# Patient Record
Sex: Male | Born: 1950
Health system: Southern US, Community
[De-identification: ages and names within clinical notes are randomized; demographics above are authoritative.]

## PROBLEM LIST (undated history)

## (undated) DIAGNOSIS — I10 Essential (primary) hypertension: Secondary | ICD-10-CM

## (undated) DIAGNOSIS — J45909 Unspecified asthma, uncomplicated: Secondary | ICD-10-CM

## (undated) HISTORY — PX: PROSTATE SURGERY: SHX751

## (undated) HISTORY — DX: Essential (primary) hypertension: I10

## (undated) HISTORY — DX: Unspecified asthma, uncomplicated: J45.909

---

## 2009-02-06 ENCOUNTER — Emergency Department (HOSPITAL_COMMUNITY): Admission: EM | Admit: 2009-02-06 | Discharge: 2009-02-06 | Payer: Self-pay | Admitting: Emergency Medicine

## 2010-04-07 ENCOUNTER — Encounter (INDEPENDENT_AMBULATORY_CARE_PROVIDER_SITE_OTHER): Payer: Self-pay | Admitting: *Deleted

## 2010-04-07 ENCOUNTER — Ambulatory Visit: Payer: Self-pay | Admitting: Gastroenterology

## 2010-04-13 ENCOUNTER — Ambulatory Visit: Payer: Self-pay | Admitting: Gastroenterology

## 2011-01-30 NOTE — Letter (Signed)
Summary: [facility] Instructions  [facility]  [address] [place], [place] [address]   Phone: [phone]  Fax: [phone]       Racer Hanger    06/03/1950    MRN: [id number]        Procedure Day Dorna Bloom:  Lenor Coffin  04/13/10     Arrival Time:  7:30AM     Procedure Time:  8:30AM     Location of Procedure:                    _X _  [facility] (4th Floor)                        PREPARATION FOR COLONOSCOPY WITH MOVIPREP   Starting 5 days prior to your procedure 04/08/10 do not eat nuts, seeds, popcorn, corn, beans, peas,  salads, or any raw vegetables.  Do not take any fiber supplements (e.g. Metamucil, Citrucel, and Benefiber).  THE DAY BEFORE YOUR PROCEDURE         DATE: 04/12/10  DAY: WEDNESDAY  1.  Drink clear liquids the entire day-NO SOLID FOOD  2.  Do not drink anything colored red or purple.  Avoid juices with pulp.  No orange juice.  3.  Drink at least 64 oz. (8 glasses) of fluid/clear liquids during the day to prevent dehydration and help the prep work efficiently.  CLEAR LIQUIDS INCLUDE: Water Jello Ice Popsicles Tea (sugar ok, no milk/cream) Powdered fruit flavored drinks Coffee (sugar ok, no milk/cream) Gatorade Juice: apple, white grape, white cranberry  Lemonade Clear bullion, consomm, broth Carbonated beverages (any kind) Strained chicken noodle soup Hard Candy                             4.  In the morning, mix first dose of MoviPrep solution:    Empty 1 Pouch A and 1 Pouch B into the disposable container    Add lukewarm drinking water to the top line of the container. Mix to dissolve    Refrigerate (mixed solution should be used within 24 hrs)  5.  Begin drinking the prep at 5:00 p.m. The MoviPrep container is divided by 4 marks.   Every 15 minutes drink the solution down to the next mark (approximately 8 oz) until the full liter is complete.   6.  Follow completed prep with 16 oz of clear liquid of your choice  (Nothing red or purple).  Continue to drink clear liquids until bedtime.  7.  Before going to bed, mix second dose of MoviPrep solution:    Empty 1 Pouch A and 1 Pouch B into the disposable container    Add lukewarm drinking water to the top line of the container. Mix to dissolve    Refrigerate  THE DAY OF YOUR PROCEDURE      DATE: 04/13/10 DAY: THURSDAY  Beginning at 3:30AM (5 hours before procedure):         1. Every 15 minutes, drink the solution down to the next mark (approx 8 oz) until the full liter is complete.  2. Follow completed prep with 16 oz. of clear liquid of your choice.    3. You may drink clear liquids until 6:30AM (2 HOURS BEFORE PROCEDURE).   MEDICATION INSTRUCTIONS  Unless otherwise instructed, you should take regular prescription medications with a small sip of water   as early as possible the morning of  your procedure.          OTHER INSTRUCTIONS  You will need a responsible adult at least 59 years of age to accompany you and drive you home.   This person must remain in the waiting room during your procedure.  Wear loose fitting clothing that is easily removed.  Leave jewelry and other valuables at home.  However, you may wish to bring a book to read or  an iPod/MP3 player to listen to music as you wait for your procedure to start.  Remove all body piercing jewelry and leave at home.  Total time from sign-in until discharge is approximately 2-3 hours.  You should go home directly after your procedure and rest.  You can resume normal activities the  day after your procedure.  The day of your procedure you should not:   Drive   Make legal decisions   Operate machinery   Drink alcohol   Return to work  You will receive specific instructions about eating, activities and medications before you leave.    The above instructions have been reviewed and explained to me by   Wyona Almas RN  April 07, 2010 8:30 AM     I fully  understand and can verbalize these instructions _____________________________ Date _________

## 2011-01-30 NOTE — Miscellaneous (Signed)
Summary: [facility] Previsit/prep  Clinical Lists Changes  Medications: Added new medication of MOVIPREP 100 GM  SOLR (PEG-KCL-NACL-NASULF-NA ASC-C) As per prep instructions. - Signed Rx of MOVIPREP 100 GM  SOLR (PEG-KCL-NACL-NASULF-NA ASC-C) As per prep instructions.;  #1 x 0;  Signed;  Entered by: Wyona Almas RN;  Authorized by: Louis Meckel MD;  Method used: Electronically to [facility]*, [address] [place], [place], [place]  [address], Ph: [phone], Fax: [phone] Observations: Added new observation of NKA: T (04/07/2010 7:54)    Prescriptions: MOVIPREP 100 GM  SOLR (PEG-KCL-NACL-NASULF-NA ASC-C) As per prep instructions.  #1 x 0   Entered by:   Wyona Almas RN   Authorized by:   Louis Meckel MD   Signed by:   Wyona Almas RN on 04/07/2010   Method used:   Electronically to        [facility]* (retail)       [address]       [place], [place]  [address]       Ph: [phone]       Fax: [phone]   RxID:   [id number]

## 2011-01-30 NOTE — Procedures (Signed)
Summary: Colonoscopy  Patient: William Lutz Note: All result statuses are Final unless otherwise noted.  Tests: (1) Colonoscopy (COL)   COL Colonoscopy           DONE     [facility]     [address]     [place], [place]  [address]           COLONOSCOPY PROCEDURE REPORT           PATIENT:  William, Lutz  MR#:  [id number]     BIRTHDATE:  May 10, 1950, 60 yrs. old  GENDER:  male           ENDOSCOPIST:  Barbette Hair. Arlyce Dice, MD     Referred by:           PROCEDURE DATE:  04/13/2010     PROCEDURE:  Diagnostic Colonoscopy     ASA CLASS:  Class II     INDICATIONS:  1) Routine Risk Screening           MEDICATIONS:   Fentanyl 50 mcg IV, Versed 7 mg IV           DESCRIPTION OF PROCEDURE:   After the risks benefits and     alternatives of the procedure were thoroughly explained, informed     consent was obtained.  Digital rectal exam was performed and     revealed perianal skin tags, perianal dermatitis.   The LB     CF-H180AL [id number] endoscope was introduced through the anus and     advanced to the cecum, which was identified by both the appendix     and ileocecal valve, without limitations.  The quality of the prep     was excellent, using MoviPrep.  The instrument was then slowly     withdrawn as the colon was fully examined.     <<PROCEDUREIMAGES>>           FINDINGS:  Mild diverticulosis was found in the sigmoid colon (see     image12 and image13).  This was otherwise a normal examination of     the colon (see image1, image2, image3, image5, image7, image9,     image11, image14, and image15).   Retroflexed views in the rectum     revealed no abnormalities.    The time to cecum =  1) 6  minutes.     The scope was then withdrawn (time =  1) 5.5  min) from the     patient and the procedure completed.           COMPLICATIONS:  None           ENDOSCOPIC IMPRESSION:     1) Mild diverticulosis in the sigmoid colon     2) Otherwise normal examination     RECOMMENDATIONS:  1) Continue current colorectal screening recommendations for     "routine risk" patients with a repeat colonoscopy in 10 years.           REPEAT EXAM:  In 10 year(s) for Colonoscopy.           ______________________________     Barbette Hair. Arlyce Dice, MD           CC:           n.     eSIGNED:   Barbette Hair. Cordarius Benning at 04/13/2010 09:09 AM           Janace Hoard, [id number]  Note: An exclamation mark (!) indicates a result that was not  dispersed into the flowsheet. Document Creation Date: 04/13/2010 9:10 AM _______________________________________________________________________  (1) Order result status: Final Collection or observation date-time: 04/13/2010 09:04 Requested date-time:  Receipt date-time:  Reported date-time:  Referring Physician:   Ordering Physician: Melvia Heaps [phone]) Specimen Source:  Source: Launa Grill Order Number: [id number] Lab site:   Appended Document: Colonoscopy    Clinical Lists Changes  Observations: Added new observation of COLONNXTDUE: 03/2020 (04/13/2010 11:20)

## 2012-01-01 HISTORY — PX: COLONOSCOPY: SHX174

## 2015-03-28 ENCOUNTER — Telehealth: Payer: Self-pay | Admitting: Family Medicine

## 2015-03-28 NOTE — Telephone Encounter (Signed)
Appointment given for Monday with Tiffany.

## 2015-04-04 ENCOUNTER — Telehealth: Payer: Self-pay | Admitting: Physician Assistant

## 2015-04-04 ENCOUNTER — Ambulatory Visit: Payer: Self-pay | Admitting: Physician Assistant

## 2015-04-04 ENCOUNTER — Telehealth: Payer: Self-pay | Admitting: *Deleted

## 2015-04-04 NOTE — Telephone Encounter (Signed)
It is okay to refill the patient's blood pressure medication until he has an appointment to see the provider here. You may refill it for one month.

## 2015-04-04 NOTE — Telephone Encounter (Signed)
Patient had a new patient appointment with Tiffany today and was not able to reschedule until next week due to his work schedule. He wants to know if we can refill his lotrel until his appointment. If you will send back to me I will take care of.

## 2015-04-05 MED ORDER — AMLODIPINE BESY-BENAZEPRIL HCL 10-20 MG PO CAPS: 1.0000 | ORAL_CAPSULE | Freq: Every day | ORAL | Status: DC

## 2015-04-05 NOTE — Telephone Encounter (Signed)
Patient aware.

## 2015-04-05 NOTE — Telephone Encounter (Signed)
Message is being handle in separate encounter.

## 2015-04-11 ENCOUNTER — Ambulatory Visit (INDEPENDENT_AMBULATORY_CARE_PROVIDER_SITE_OTHER): Payer: 59 | Admitting: Physician Assistant

## 2015-04-11 ENCOUNTER — Encounter: Payer: Self-pay | Admitting: Physician Assistant

## 2015-04-11 VITALS — BP 126/71 | HR 69 | Temp 97.6°F | Ht 72.0 in | Wt 203.0 lb

## 2015-04-11 DIAGNOSIS — I10 Essential (primary) hypertension: Secondary | ICD-10-CM | POA: Diagnosis not present

## 2015-04-11 DIAGNOSIS — Z0000 Encounter for general adult medical examination without abnormal findings: Secondary | ICD-10-CM

## 2015-04-11 DIAGNOSIS — Z13220 Encounter for screening for lipoid disorders: Secondary | ICD-10-CM | POA: Diagnosis not present

## 2015-04-11 MED ORDER — AMLODIPINE BESY-BENAZEPRIL HCL 10-20 MG PO CAPS: 1.0000 | ORAL_CAPSULE | Freq: Every day | ORAL | Status: DC

## 2015-04-11 NOTE — Progress Notes (Signed)
   Subjective:    Patient ID: William Lutz, male    DOB: [birth year], 65 y.o.   MRN: [id number]  HPI 65 y/o male presents for re-establishment of care. He has been being treated for hypertension x 10 years and medication has not been changed. He needs refills today. He refuses  a full physical exam but agrees to blood work.     Review of Systems  Constitutional: Negative.   HENT: Negative.   Respiratory: Negative.   Cardiovascular: Negative.   Genitourinary: Negative.   Psychiatric/Behavioral: Negative.        Objective:   Physical Exam  Constitutional: He is oriented to person, place, and time. He appears well-developed and well-nourished. No distress.  HENT:  Head: Normocephalic.  Cardiovascular: Normal rate, regular rhythm and normal heart sounds.  Exam reveals no gallop and no friction rub.   No murmur heard. Pulmonary/Chest: Effort normal and breath sounds normal. No respiratory distress. He has no wheezes. He has no rales. He exhibits no tenderness.  Neurological: He is alert and oriented to person, place, and time. He has normal reflexes.  Skin: He is not diaphoretic.  Vitals reviewed.         Assessment & Plan:  1. Hypertension: Refilled Norvasc since this seems to be working for the patient. F/U in 1 year unless labs indicate otherwise.   2. Wellness exam:   - CBC, CMP, thyroid, HA1C, PSA, lipid, Vit D - patient is not fasting and will f/u within 2 weeks for labs.

## 2015-04-11 NOTE — Patient Instructions (Signed)
Return to clinic within 2 weeks for labs

## 2015-04-18 ENCOUNTER — Other Ambulatory Visit (INDEPENDENT_AMBULATORY_CARE_PROVIDER_SITE_OTHER): Payer: 59

## 2015-04-18 DIAGNOSIS — I10 Essential (primary) hypertension: Secondary | ICD-10-CM | POA: Diagnosis not present

## 2015-04-18 DIAGNOSIS — Z0000 Encounter for general adult medical examination without abnormal findings: Secondary | ICD-10-CM

## 2015-04-18 DIAGNOSIS — Z13220 Encounter for screening for lipoid disorders: Secondary | ICD-10-CM

## 2015-04-19 LAB — CBC WITH DIFFERENTIAL/PLATELET
Basophils Absolute: 0 10*3/uL (ref 0.0–0.2)
Basos: 0 %
Eos: 4 %
Eosinophils Absolute: 0.2 10*3/uL (ref 0.0–0.4)
HCT: 42.7 % (ref 37.5–51.0)
Hemoglobin: 14.5 g/dL (ref 12.6–17.7)
IMMATURE GRANS (ABS): 0 10*3/uL (ref 0.0–0.1)
IMMATURE GRANULOCYTES: 0 %
LYMPHS: 29 %
Lymphocytes Absolute: 1.6 10*3/uL (ref 0.7–3.1)
MCH: 28.4 pg (ref 26.6–33.0)
MCHC: 34 g/dL (ref 31.5–35.7)
MCV: 84 fL (ref 79–97)
MONOCYTES: 11 %
Monocytes Absolute: 0.6 10*3/uL (ref 0.1–0.9)
NEUTROS PCT: 56 %
Neutrophils Absolute: 3 10*3/uL (ref 1.4–7.0)
PLATELETS: 167 10*3/uL (ref 150–379)
RBC: 5.1 x10E6/uL (ref 4.14–5.80)
RDW: 13.5 % (ref 12.3–15.4)
WBC: 5.5 10*3/uL (ref 3.4–10.8)

## 2015-04-19 LAB — PSA: PSA: 1.1 ng/mL (ref 0.0–4.0)

## 2015-04-19 LAB — BASIC METABOLIC PANEL
BUN / CREAT RATIO: 14 (ref 10–22)
BUN: 15 mg/dL (ref 8–27)
CALCIUM: 9.7 mg/dL (ref 8.6–10.2)
CHLORIDE: 101 mmol/L (ref 97–108)
CO2: 26 mmol/L (ref 18–29)
Creatinine, Ser: 1.05 mg/dL (ref 0.76–1.27)
GFR calc Af Amer: 87 mL/min/{1.73_m2} (ref >59)
GFR calc non Af Amer: 75 mL/min/{1.73_m2} (ref >59)
Glucose: 98 mg/dL (ref 65–99)
POTASSIUM: 4.7 mmol/L (ref 3.5–5.2)
SODIUM: 142 mmol/L (ref 134–144)

## 2015-04-19 LAB — LIPID PANEL
CHOL/HDL RATIO: 4.9 ratio (ref 0.0–5.0)
Cholesterol, Total: 142 mg/dL (ref 100–199)
HDL: 29 mg/dL — AB (ref >39)
LDL Calculated: 77 mg/dL (ref 0–99)
Triglycerides: 178 mg/dL — ABNORMAL HIGH (ref 0–149)
VLDL Cholesterol Cal: 36 mg/dL (ref 5–40)

## 2015-04-19 LAB — HEMOGLOBIN A1C
ESTIMATED AVERAGE GLUCOSE: 126 mg/dL
Hgb A1c MFr Bld: 6 % — ABNORMAL HIGH (ref 4.8–5.6)

## 2015-04-19 LAB — VITAMIN D 25 HYDROXY (VIT D DEFICIENCY, FRACTURES): Vit D, 25-Hydroxy: 42.1 ng/mL (ref 30.0–100.0)

## 2015-04-19 NOTE — Progress Notes (Signed)
Will repeat in 6 months

## 2015-04-20 ENCOUNTER — Telehealth: Payer: Self-pay | Admitting: *Deleted

## 2015-04-20 NOTE — Telephone Encounter (Signed)
-----   Message from Adella Nissen, PA-C sent at 04/19/2015  5:23 PM EDT ----- WNL. Have patient f/u for repeat of lipids in 6 months. Tiffany A. Benjamin Stain PA-C

## 2015-04-20 NOTE — Telephone Encounter (Signed)
lmtcb regarding test results. 

## 2016-01-10 ENCOUNTER — Encounter: Payer: Self-pay | Admitting: Family Medicine

## 2016-01-10 ENCOUNTER — Ambulatory Visit (INDEPENDENT_AMBULATORY_CARE_PROVIDER_SITE_OTHER): Payer: BLUE CROSS/BLUE SHIELD | Admitting: Family Medicine

## 2016-01-10 VITALS — BP 125/77 | HR 72 | Temp 97.4°F | Ht 73.83 in | Wt 206.6 lb

## 2016-01-10 DIAGNOSIS — G5603 Carpal tunnel syndrome, bilateral upper limbs: Secondary | ICD-10-CM

## 2016-01-10 DIAGNOSIS — D235 Other benign neoplasm of skin of trunk: Secondary | ICD-10-CM | POA: Diagnosis not present

## 2016-01-10 DIAGNOSIS — D225 Melanocytic nevi of trunk: Secondary | ICD-10-CM

## 2016-01-10 NOTE — Progress Notes (Signed)
BP 125/77 mmHg  Pulse 72  Temp(Src) 97.4 F (36.3 C) (Oral)  Ht 6' 1.83" (1.875 m)  Wt 206 lb 9.6 oz (93.713 kg)  BMI 26.66 kg/m2   Subjective:    Patient ID: William Lutz, male    DOB: 25-Jun-1950, 66 y.o.   MRN: [id number]  HPI: William Lutz is a 66 y.o. male presenting on 01/10/2016 for black spot and Carpal Tunnel   HPI Skin lesion Patient presents today with to have a concerning skin lesion looked at. It is upper middle back he has a few nevi and then one that appears darker, 2 toned and more raised than the others. She has been told his wife that he had that there and then his massage therapist said to him most recently that looked like it was changing or growing on him. He does not really notice it because he cannot see that part of his back. He does admit that he goes through regular tanning sessions because of his psoriasis for treatment.  Bilateral wrist pain Patient has a concern because he is been diagnosed with carpal tunnel before and feels like it is worsening. He is having increased pain in his wrists which is shooting down the into his thumb and first fingers which she describes as a sharp shooting pain. He also describes that he has had increased pain and tingling sensation that is shooting up his arm and sometimes even as high as up into his shoulder. He has been wearing his splints for his carpal tunnel every night but they are not immune to help anymore. Over the past few nights he has been woken up by the pain associated with his arms and wrists and shoulders. He also complains of some loss of grip strength more recently.  Relevant past medical, surgical, family and social history reviewed and updated as indicated. Interim medical history since our last visit reviewed. Allergies and medications reviewed and updated.  Review of Systems  Constitutional: Negative for fever and chills.  HENT: Negative for ear discharge and ear pain.   Eyes: Negative for discharge and  visual disturbance.  Respiratory: Negative for shortness of breath and wheezing.   Cardiovascular: Negative for chest pain and leg swelling.  Gastrointestinal: Negative for abdominal pain, diarrhea and constipation.  Genitourinary: Negative for difficulty urinating.  Musculoskeletal: Positive for arthralgias. Negative for back pain and gait problem.  Skin: Positive for color change. Negative for rash.  Neurological: Positive for weakness (grip strength weakness) and numbness. Negative for syncope, light-headedness and headaches.  All other systems reviewed and are negative.   Per HPI unless specifically indicated above     Medication List       This list is accurate as of: 01/10/16  5:04 PM.  Always use your most recent med list.               amLODipine-benazepril 10-20 MG capsule  Commonly known as:  LOTREL  Take 1 capsule by mouth daily.     aspirin 81 MG tablet  Take 81 mg by mouth daily.     Fish Oil 1000 MG Caps  Take by mouth.     niacin 250 MG tablet  Take 250 mg by mouth at bedtime.           Objective:    BP 125/77 mmHg  Pulse 72  Temp(Src) 97.4 F (36.3 C) (Oral)  Ht 6' 1.83" (1.875 m)  Wt 206 lb 9.6 oz (93.713 kg)  BMI 26.66 kg/m2  Wt Readings from Last 3 Encounters:  01/10/16 206 lb 9.6 oz (93.713 kg)  04/11/15 203 lb (92.08 kg)    Physical Exam  Constitutional: He is oriented to person, place, and time. He appears well-developed and well-nourished. No distress.  Eyes: Conjunctivae and EOM are normal. Pupils are equal, round, and reactive to light. Right eye exhibits no discharge. No scleral icterus.  Neck: Neck supple. No thyromegaly present.  Cardiovascular: Normal rate, regular rhythm, normal heart sounds and intact distal pulses.   No murmur heard. Pulmonary/Chest: Effort normal and breath sounds normal. No respiratory distress. He has no wheezes.  Abdominal: He exhibits no distension.  Musculoskeletal: Normal range of motion. He exhibits  no edema.  On bilateral wrists no tenderness was noted on exam by palpation. Tinel sign positive, Phalen's negative. No loss of range of motion. Some decreased grip strength is noted  Lymphadenopathy:    He has no cervical adenopathy.  Neurological: He is alert and oriented to person, place, and time. Coordination normal.  Skin: Skin is warm and dry. No rash noted. He is not diaphoretic.     Psychiatric: He has a normal mood and affect. His behavior is normal.  Vitals reviewed.      Assessment & Plan:   Problem List Items Addressed This Visit    None    Visit Diagnoses    Bilateral carpal tunnel syndrome    -  Primary    Relevant Orders    Ambulatory referral to Orthopedic Surgery    Atypical nevus of back        Return for removal        Follow up plan: Return if symptoms worsen or fail to improve, for Atypical mole removal, back.  Counseling provided for all of the vaccine components Orders Placed This Encounter  Procedures  . Ambulatory referral to [facility] Dellis Voght, MD [facility] 01/10/2016, 5:04 PM

## 2016-01-11 ENCOUNTER — Ambulatory Visit (INDEPENDENT_AMBULATORY_CARE_PROVIDER_SITE_OTHER): Payer: BLUE CROSS/BLUE SHIELD | Admitting: Family Medicine

## 2016-01-11 ENCOUNTER — Encounter: Payer: Self-pay | Admitting: Family Medicine

## 2016-01-11 VITALS — BP 129/76 | HR 74 | Temp 99.1°F | Ht 73.8 in | Wt 206.9 lb

## 2016-01-11 DIAGNOSIS — L989 Disorder of the skin and subcutaneous tissue, unspecified: Secondary | ICD-10-CM

## 2016-01-13 NOTE — Progress Notes (Signed)
BP 129/76 mmHg  Pulse 74  Temp(Src) 99.1 F (37.3 C) (Oral)  Ht 6' 1.8" (1.875 m)  Wt 206 lb 14.4 oz (93.849 kg)  BMI 26.69 kg/m2   Subjective:    Patient ID: William Lutz, male    DOB: 1950/11/14, 66 y.o.   MRN: [id number]  HPI: William Lutz is a 66 y.o. male presenting on 01/11/2016 for No chief complaint on file.   HPI Atypical skin lesion Patient has a skin lesion in the center of his back that his massage therapist is recently told him that is been changing over the past couple months in both color and size. He has never had any lesions that have been cancerous previously increased swelling make sure this one was nothing serious. The lesion is pigmented and raised, nonpruritic and nonpainful and nonerythematous.  Relevant past medical, surgical, family and social history reviewed and updated as indicated. Interim medical history since our last visit reviewed. Allergies and medications reviewed and updated.  Review of Systems  Constitutional: Negative for fever.  HENT: Negative for ear discharge and ear pain.   Eyes: Negative for discharge and visual disturbance.  Respiratory: Negative for shortness of breath and wheezing.   Cardiovascular: Negative for chest pain and leg swelling.  Gastrointestinal: Negative for abdominal pain, diarrhea and constipation.  Genitourinary: Negative for difficulty urinating.  Musculoskeletal: Negative for back pain and gait problem.  Skin: Positive for color change. Negative for rash.  Neurological: Negative for syncope, light-headedness and headaches.  All other systems reviewed and are negative.   Per HPI unless specifically indicated above     Medication List       This list is accurate as of: 01/11/16 11:59 PM.  Always use your most recent med list.               amLODipine-benazepril 10-20 MG capsule  Commonly known as:  LOTREL  Take 1 capsule by mouth daily.     aspirin 81 MG tablet  Take 81 mg by mouth daily.     Fish  Oil 1000 MG Caps  Take by mouth.     niacin 250 MG tablet  Take 250 mg by mouth at bedtime.           Objective:    BP 129/76 mmHg  Pulse 74  Temp(Src) 99.1 F (37.3 C) (Oral)  Ht 6' 1.8" (1.875 m)  Wt 206 lb 14.4 oz (93.849 kg)  BMI 26.69 kg/m2  Wt Readings from Last 3 Encounters:  01/11/16 206 lb 14.4 oz (93.849 kg)  01/10/16 206 lb 9.6 oz (93.713 kg)  04/11/15 203 lb (92.08 kg)    Physical Exam  Constitutional: He is oriented to person, place, and time. He appears well-developed and well-nourished. No distress.  Eyes: Conjunctivae and EOM are normal. Right eye exhibits no discharge. No scleral icterus.  Cardiovascular: Normal rate, regular rhythm, normal heart sounds and intact distal pulses.   No murmur heard. Pulmonary/Chest: Effort normal and breath sounds normal. No respiratory distress. He has no wheezes.  Musculoskeletal: Normal range of motion. He exhibits no edema.  Neurological: He is alert and oriented to person, place, and time. Coordination normal.  Skin: Skin is warm and dry. Lesion (aised pigmented lesion in the center of his back and the upper thoracic region. Could be atypical nevus versus seborrheic keratosis.) noted. No rash noted. He is not diaphoretic.  Psychiatric: He has a normal mood and affect. His behavior is normal.  Nursing note and vitals  reviewed.  Skin lesion removal: Prepped area with Betadine Shave biopsy technique used. 2% lidocaine with epinephrine was used for local anesthesia, 33mL. Used silver nitrate to achieve hemostasis and topical antibiotic was used and then it was covered by 4 x 4 and tape told in place. Procedure was tolerated well     Assessment & Plan:   Problem List Items Addressed This Visit    None    Visit Diagnoses    Skin lesion of back    -  Primary    Relevant Orders    Pathology        Follow up plan: Return if symptoms worsen or fail to improve.  Counseling provided for all of the vaccine components No  orders of the defined types were placed in this encounter.    Caryl Pina, MD [facility] 01/13/2016, 8:00 AM

## 2016-01-16 LAB — PATHOLOGY

## 2016-02-06 ENCOUNTER — Encounter (HOSPITAL_BASED_OUTPATIENT_CLINIC_OR_DEPARTMENT_OTHER): Payer: Self-pay | Admitting: *Deleted

## 2016-02-06 ENCOUNTER — Other Ambulatory Visit: Payer: Self-pay | Admitting: Orthopedic Surgery

## 2016-02-08 ENCOUNTER — Encounter (HOSPITAL_BASED_OUTPATIENT_CLINIC_OR_DEPARTMENT_OTHER)
Admission: RE | Admit: 2016-02-08 | Discharge: 2016-02-08 | Disposition: A | Discharge status: Home / Self Care | Payer: Self-pay | Source: Ambulatory Visit | Attending: Orthopedic Surgery | Admitting: Orthopedic Surgery

## 2016-02-08 DIAGNOSIS — I10 Essential (primary) hypertension: Secondary | ICD-10-CM | POA: Diagnosis not present

## 2016-02-08 DIAGNOSIS — Z79899 Other long term (current) drug therapy: Secondary | ICD-10-CM | POA: Diagnosis not present

## 2016-02-08 DIAGNOSIS — Z87891 Personal history of nicotine dependence: Secondary | ICD-10-CM | POA: Diagnosis not present

## 2016-02-08 DIAGNOSIS — G5601 Carpal tunnel syndrome, right upper limb: Secondary | ICD-10-CM | POA: Diagnosis not present

## 2016-02-09 ENCOUNTER — Ambulatory Visit (HOSPITAL_BASED_OUTPATIENT_CLINIC_OR_DEPARTMENT_OTHER): Payer: BLUE CROSS/BLUE SHIELD | Admitting: Certified Registered"

## 2016-02-09 ENCOUNTER — Encounter (HOSPITAL_BASED_OUTPATIENT_CLINIC_OR_DEPARTMENT_OTHER): Payer: Self-pay | Admitting: Certified Registered"

## 2016-02-09 ENCOUNTER — Encounter (HOSPITAL_BASED_OUTPATIENT_CLINIC_OR_DEPARTMENT_OTHER)
Admission: RE | Disposition: A | Discharge status: Home / Self Care | Payer: Self-pay | Source: Ambulatory Visit | Attending: Orthopedic Surgery

## 2016-02-09 ENCOUNTER — Ambulatory Visit (HOSPITAL_BASED_OUTPATIENT_CLINIC_OR_DEPARTMENT_OTHER)
Admission: RE | Admit: 2016-02-09 | Discharge: 2016-02-09 | Disposition: A | Discharge status: Home / Self Care | Payer: BLUE CROSS/BLUE SHIELD | Source: Ambulatory Visit | Attending: Orthopedic Surgery | Admitting: Orthopedic Surgery

## 2016-02-09 DIAGNOSIS — Z87891 Personal history of nicotine dependence: Secondary | ICD-10-CM | POA: Insufficient documentation

## 2016-02-09 DIAGNOSIS — I10 Essential (primary) hypertension: Secondary | ICD-10-CM | POA: Insufficient documentation

## 2016-02-09 DIAGNOSIS — G5601 Carpal tunnel syndrome, right upper limb: Secondary | ICD-10-CM | POA: Diagnosis not present

## 2016-02-09 DIAGNOSIS — Z79899 Other long term (current) drug therapy: Secondary | ICD-10-CM | POA: Insufficient documentation

## 2016-02-09 HISTORY — PX: CARPAL TUNNEL RELEASE: SHX101

## 2016-02-09 SURGERY — CARPAL TUNNEL RELEASE
Anesthesia: Monitor Anesthesia Care | Site: Wrist | Laterality: Right

## 2016-02-09 MED ORDER — KETOROLAC TROMETHAMINE 30 MG/ML IJ SOLN: 30.0000 mg | Freq: Once | INTRAMUSCULAR | Status: DC | PRN

## 2016-02-09 MED ORDER — MIDAZOLAM HCL 2 MG/2ML IJ SOLN
1.0000 mg | INTRAMUSCULAR | Status: DC | PRN
  Administered 2016-02-09: 2 mg via INTRAVENOUS

## 2016-02-09 MED ORDER — LIDOCAINE HCL (PF) 0.5 % IJ SOLN
INTRAMUSCULAR | Status: DC | PRN
  Administered 2016-02-09: 35 mL via INTRAVENOUS

## 2016-02-09 MED ORDER — ONDANSETRON HCL 4 MG/2ML IJ SOLN
INTRAMUSCULAR | Status: DC | PRN
  Administered 2016-02-09: 4 mg via INTRAVENOUS

## 2016-02-09 MED ORDER — 0.9 % SODIUM CHLORIDE (POUR BTL) OPTIME
TOPICAL | Status: DC | PRN
  Administered 2016-02-09: 100 mL

## 2016-02-09 MED ORDER — CEFAZOLIN SODIUM-DEXTROSE 2-3 GM-% IV SOLR
2.0000 g | INTRAVENOUS | Status: AC
Start: 2016-02-10 — End: 2016-02-09
  Administered 2016-02-09: 2 g via INTRAVENOUS

## 2016-02-09 MED ORDER — FENTANYL CITRATE (PF) 100 MCG/2ML IJ SOLN: 25.0000 ug | INTRAMUSCULAR | Status: DC | PRN

## 2016-02-09 MED ORDER — SCOPOLAMINE 1 MG/3DAYS TD PT72: 1.0000 | MEDICATED_PATCH | Freq: Once | TRANSDERMAL | Status: DC | PRN

## 2016-02-09 MED ORDER — FENTANYL CITRATE (PF) 100 MCG/2ML IJ SOLN
50.0000 ug | INTRAMUSCULAR | Status: DC | PRN
  Administered 2016-02-09 (×2): 50 ug via INTRAVENOUS

## 2016-02-09 MED ORDER — DEXAMETHASONE SODIUM PHOSPHATE 10 MG/ML IJ SOLN
INTRAMUSCULAR | Status: AC
  Filled 2016-02-09: qty 1

## 2016-02-09 MED ORDER — LACTATED RINGERS IV SOLN
INTRAVENOUS | Status: DC
  Administered 2016-02-09 (×2): via INTRAVENOUS

## 2016-02-09 MED ORDER — CEFAZOLIN SODIUM-DEXTROSE 2-3 GM-% IV SOLR
INTRAVENOUS | Status: AC
  Filled 2016-02-09: qty 50

## 2016-02-09 MED ORDER — HYDROCODONE-ACETAMINOPHEN 5-325 MG PO TABS: ORAL_TABLET | ORAL | Status: DC

## 2016-02-09 MED ORDER — FENTANYL CITRATE (PF) 100 MCG/2ML IJ SOLN
INTRAMUSCULAR | Status: AC
  Filled 2016-02-09: qty 2

## 2016-02-09 MED ORDER — CHLORHEXIDINE GLUCONATE 4 % EX LIQD: 60.0000 mL | Freq: Once | CUTANEOUS | Status: DC

## 2016-02-09 MED ORDER — LIDOCAINE HCL (CARDIAC) 20 MG/ML IV SOLN
INTRAVENOUS | Status: AC
  Filled 2016-02-09: qty 5

## 2016-02-09 MED ORDER — BUPIVACAINE HCL (PF) 0.25 % IJ SOLN
INTRAMUSCULAR | Status: DC | PRN
  Administered 2016-02-09: 10 mL

## 2016-02-09 MED ORDER — GLYCOPYRROLATE 0.2 MG/ML IJ SOLN: 0.2000 mg | Freq: Once | INTRAMUSCULAR | Status: DC | PRN

## 2016-02-09 MED ORDER — PROMETHAZINE HCL 25 MG/ML IJ SOLN: 6.2500 mg | INTRAMUSCULAR | Status: DC | PRN

## 2016-02-09 MED FILL — HYDROCODON-APAP 5-325: 5-325 | 4 days supply | Qty: 30 | Fill #0

## 2016-02-09 SURGICAL SUPPLY — 35 items
BANDAGE ACE 3X5.8 VEL STRL LF (GAUZE/BANDAGES/DRESSINGS) ×2 IMPLANT
BLADE SURG 15 STRL LF DISP TIS (BLADE) ×2 IMPLANT
BLADE SURG 15 STRL SS (BLADE) ×2
BNDG ESMARK 4X9 LF (GAUZE/BANDAGES/DRESSINGS) ×2 IMPLANT
BNDG GAUZE ELAST 4 BULKY (GAUZE/BANDAGES/DRESSINGS) ×2 IMPLANT
CHLORAPREP W/TINT 26ML (MISCELLANEOUS) ×2 IMPLANT
CORDS BIPOLAR (ELECTRODE) ×2 IMPLANT
COVER BACK TABLE 60X90IN (DRAPES) ×2 IMPLANT
COVER MAYO STAND STRL (DRAPES) ×2 IMPLANT
CUFF TOURNIQUET SINGLE 18IN (TOURNIQUET CUFF) ×2 IMPLANT
DRAPE EXTREMITY T 121X128X90 (DRAPE) ×2 IMPLANT
DRAPE SURG 17X23 STRL (DRAPES) ×2 IMPLANT
DRSG PAD ABDOMINAL 8X10 ST (GAUZE/BANDAGES/DRESSINGS) ×2 IMPLANT
GAUZE SPONGE 4X4 12PLY STRL (GAUZE/BANDAGES/DRESSINGS) ×2 IMPLANT
GAUZE XEROFORM 1X8 LF (GAUZE/BANDAGES/DRESSINGS) ×2 IMPLANT
GLOVE BIO SURGEON STRL SZ7.5 (GLOVE) ×2 IMPLANT
GLOVE BIOGEL M STRL SZ7.5 (GLOVE) ×2 IMPLANT
GLOVE BIOGEL PI IND STRL 7.0 (GLOVE) ×1 IMPLANT
GLOVE BIOGEL PI IND STRL 8 (GLOVE) ×2 IMPLANT
GLOVE BIOGEL PI INDICATOR 7.0 (GLOVE) ×1
GLOVE BIOGEL PI INDICATOR 8 (GLOVE) ×2
GOWN STRL REUS W/ TWL LRG LVL3 (GOWN DISPOSABLE) ×1 IMPLANT
GOWN STRL REUS W/TWL LRG LVL3 (GOWN DISPOSABLE) ×1
GOWN STRL REUS W/TWL XL LVL3 (GOWN DISPOSABLE) ×4 IMPLANT
NEEDLE HYPO 25X1 1.5 SAFETY (NEEDLE) ×2 IMPLANT
NS IRRIG 1000ML POUR BTL (IV SOLUTION) ×2 IMPLANT
PACK BASIN DAY SURGERY FS (CUSTOM PROCEDURE TRAY) ×2 IMPLANT
PADDING CAST ABS 4INX4YD NS (CAST SUPPLIES) ×1
PADDING CAST ABS COTTON 4X4 ST (CAST SUPPLIES) ×1 IMPLANT
STOCKINETTE 4X48 STRL (DRAPES) ×2 IMPLANT
SUT ETHILON 4 0 PS 2 18 (SUTURE) ×2 IMPLANT
SYR BULB 3OZ (MISCELLANEOUS) ×2 IMPLANT
SYR CONTROL 10ML LL (SYRINGE) ×2 IMPLANT
TOWEL OR 17X24 6PK STRL BLUE (TOWEL DISPOSABLE) ×4 IMPLANT
UNDERPAD 30X30 (UNDERPADS AND DIAPERS) ×2 IMPLANT

## 2016-02-09 NOTE — Discharge Instructions (Addendum)

## 2016-02-09 NOTE — Op Note (Signed)
[id number] 

## 2016-02-09 NOTE — Anesthesia Postprocedure Evaluation (Signed)
Anesthesia Post Note  Patient: William Lutz  Procedure(s) Performed: Procedure(s) (LRB): RIGHT CARPAL TUNNEL RELEASE (Right)  Patient location during evaluation: PACU Anesthesia Type: Bier Block and MAC Level of consciousness: awake and alert Pain management: pain level controlled Vital Signs Assessment: post-procedure vital signs reviewed and stable Respiratory status: spontaneous breathing, nonlabored ventilation, respiratory function stable and patient connected to nasal cannula oxygen Cardiovascular status: blood pressure returned to baseline and stable Postop Assessment: no signs of nausea or vomiting Anesthetic complications: no    Last Vitals:  Filed Vitals:   02/09/16 1430 02/09/16 1445  BP: 119/79 125/85  Pulse: 65 71  Temp:    Resp: 14 16    Last Pain:  Filed Vitals:   02/09/16 1453  PainSc: 0-No pain                 Aarya Robinson S

## 2016-02-09 NOTE — H&P (Signed)
  William Lutz is an 66 y.o. male.   Chief Complaint: right carpal tunnel syndrome HPI: 66 yo male with pins and needles sensation in bilateral fingertips.  Worsened with vibratory tools.  Nocturnal symptoms wake him at night.  Positive nerve conduction studies.  He wishes to have a carpal tunnel release.  Allergies: No Known Allergies  Past Medical History  Diagnosis Date  . Asthma     Hx of asthma but no longer a problem, once he quit smoking  . Hypertension     Past Surgical History  Procedure Laterality Date  . Colonoscopy N/A 2013  . Prostate surgery      pt had a biopsy of the prostate-everything came back wnl per pt    Family History: History reviewed. No pertinent family history.  Social History:   reports that he has quit smoking. He does not have any smokeless tobacco history on file. He reports that he does not drink alcohol or use illicit drugs.  Medications: No prescriptions prior to admission    No results found for this or any previous visit (from the past 48 hour(s)).  No results found.   A comprehensive review of systems was negative.  Height 6\' 1"  (1.854 m), weight 93.441 kg (206 lb).  General appearance: alert, cooperative and appears stated age Head: Normocephalic, without obvious abnormality, atraumatic Neck: supple, symmetrical, trachea midline Resp: clear to auscultation bilaterally Cardio: regular rate and rhythm GI: non-tender Extremities: intact sensation and capillary refill all digits.  +epl/fpl/io.  no wounds Pulses: 2+ and symmetric Skin: Skin color, texture, turgor normal. No rashes or lesions Neurologic: Grossly normal Incision/Wound: none  Assessment/Plan Right carpal tunnel syndrome.  Non operative and operative treatment options were discussed with the patient and patient wishes to proceed with operative treatment. Risks, benefits, and alternatives of surgery were discussed and the patient agrees with the plan of  care.   Radhika Dershem R 02/09/2016, 11:56 AM

## 2016-02-09 NOTE — Op Note (Signed)
NAME:  William Lutz, William Lutz                ACCOUNT NO.:  [id number]  MEDICAL RECORD NO.:  [id number]  LOCATION:                                 FACILITY:  PHYSICIAN:  William Cover, MD             DATE OF BIRTH:  DATE OF PROCEDURE:  02/09/2016 DATE OF DISCHARGE:                              OPERATIVE REPORT   PREOPERATIVE DIAGNOSIS:  Right carpal tunnel syndrome.  POSTOPERATIVE DIAGNOSIS:  Right carpal tunnel syndrome.  PROCEDURE:  Right carpal tunnel release.  SURGEON:  William Cover, MD.  ASSISTANT:  None.  ANESTHESIA:  Bier block with sedation.  IV FLUIDS:  Per anesthesia flow sheet.  ESTIMATED BLOOD LOSS:  Minimal.  COMPLICATIONS:  None.  SPECIMENS:  None.  TOURNIQUET TIME:  25 minutes.  DISPOSITION:  Stable to PACU.  INDICATIONS:  William Lutz is a 66 year old male who has had pins and needle sensation in the right hand.  He has nocturnal symptoms and positive nerve conduction studies.  He wished to have a carpal tunnel release for management of symptoms.  Risks, benefits, and alternatives of surgery were discussed including risk of blood loss; infection; damage to nerves, vessels, tendons, ligaments, bone; failure of surgery; need for additional surgery; complications with wound healing; continued pain; recurrence of carpal tunnel syndrome; and damage to motor branch. He voiced understanding of these risks and elected to proceed.  OPERATIVE COURSE:  After being identified preoperatively by myself, the patient and I agreed upon procedure and site of procedure.  Surgical site marked.  The risks, benefits, and alternatives of surgery were reviewed and he wished to proceed.  Surgical consent had been signed. He was given IV Ancef as preoperative antibiotic prophylaxis.  He was transported to the operating room and placed on the operating room table in supine position with the right upper extremity on arm board.  Bier block anesthesia was induced by Anesthesiology.  Right  upper extremity was prepped and draped in normal sterile orthopedic fashion.  A surgical pause was performed between surgeons, anesthesia, and operating room staff, and all were in agreement as to the patient, procedure, and site of procedure.  Tourniquet at the proximal aspect of the forearm had been inflated with Bier block.  An incision was made over the transverse carpal ligament and carried into subcutaneous tissues by spreading technique.  Bipolar electrocautery was used to obtain hemostasis.  The palmar fascia was sharply incised.  Transverse carpal ligament was identified and sharply incised.  It was incised distally first.  Care was taken to ensure complete decompression distally.  It was then incised proximally.  Scissors were used to split the distal aspect of the volar antebrachial fascia.  A finger was placed into the wound to ensure complete decompression which was the case.  The nerve was inspected.  It was flattened and hyperemic.  The motor branch was identified and was intact.  The wound was irrigated with sterile saline and closed with 4-0 nylon in a horizontal mattress fashion.  It was injected with 10 mL of 0.25% plain Marcaine to aid in postoperative analgesia.  It was then dressed with sterile Xeroform, 4x4s, and  ABD and wrapped with Kerlix and Ace bandage.  Tourniquet was deflated at 25 minutes.  The fingertips were pink with brisk capillary refill after deflation of the tourniquet.  Operative drapes were broken down and the patient was awoken from anesthesia safely.  He was transferred back to stretcher and taken to PACU in stable condition.  I will see him back in the office in 1 week for postoperative followup.  I will give him Norco 5/325, 1-2 p.o. q.6 hours p.r.n. pain, dispensed #30.     William Cover, MD     KK/MEDQ  D:  02/09/2016  T:  02/09/2016  Job:  [id number]

## 2016-02-09 NOTE — Anesthesia Procedure Notes (Signed)
Procedure Name: MAC Performed by: Baxter Flattery Pre-anesthesia Checklist: Patient identified, Emergency Drugs available, Suction available and Patient being monitored Patient Re-evaluated:Patient Re-evaluated prior to inductionOxygen Delivery Method: Simple face mask Preoxygenation: Pre-oxygenation with 100% oxygen Intubation Type: IV induction Ventilation: Mask ventilation without difficulty Dental Injury: Teeth and Oropharynx as per pre-operative assessment

## 2016-02-09 NOTE — Transfer of Care (Signed)
Immediate Anesthesia Transfer of Care Note  Patient: William Lutz  Procedure(s) Performed: Procedure(s): RIGHT CARPAL TUNNEL RELEASE (Right)  Patient Location: PACU  Anesthesia Type:MAC and Bier block  Level of Consciousness: awake, alert  and oriented  Airway & Oxygen Therapy: Patient Spontanous Breathing and Patient connected to face mask oxygen  Post-op Assessment: Report given to RN, Post -op Vital signs reviewed and stable and Patient moving all extremities  Post vital signs: Reviewed and stable  Last Vitals:  Filed Vitals:   02/09/16 1249  BP: 139/84  Pulse: 67  Temp: 36.4 C  Resp: 20    Complications: No apparent anesthesia complications

## 2016-02-09 NOTE — Brief Op Note (Signed)
02/09/2016  2:19 PM  PATIENT:  Ruben Reason  66 y.o. male  PRE-OPERATIVE DIAGNOSIS:  RIGHT CARPAL TUNNEL SYNDROME   POST-OPERATIVE DIAGNOSIS:  RIGHT CARPAL TUNNEL SYNDROME   PROCEDURE:  Procedure(s): RIGHT CARPAL TUNNEL RELEASE (Right)  SURGEON:  Surgeon(s) and Role:    * Leanora Cover, MD - Primary  PHYSICIAN ASSISTANT:   ASSISTANTS: none   ANESTHESIA:   Bier block with sedation  EBL:  Total I/O In: 700 [I.V.:700] Out: -   BLOOD ADMINISTERED:none  DRAINS: none   LOCAL MEDICATIONS USED:  MARCAINE     SPECIMEN:  No Specimen  DISPOSITION OF SPECIMEN:  N/A  COUNTS:  YES  TOURNIQUET:   Total Tourniquet Time Documented: Forearm (Right) - 25 minutes Total: Forearm (Right) - 25 minutes   DICTATION: .Other Dictation: Dictation Number [id number]  PLAN OF CARE: Discharge to home after PACU  PATIENT DISPOSITION:  PACU - hemodynamically stable.

## 2016-02-09 NOTE — Anesthesia Preprocedure Evaluation (Signed)
Anesthesia Evaluation  Patient identified by MRN, date of birth, ID band Patient awake    Reviewed: Allergy & Precautions, NPO status , Patient's Chart, lab work & pertinent test results  Airway Mallampati: II  TM Distance: >3 FB Neck ROM: Full    Dental no notable dental hx.    Pulmonary neg pulmonary ROS, former smoker,    Pulmonary exam normal breath sounds clear to auscultation       Cardiovascular hypertension, Pt. on medications Normal cardiovascular exam Rhythm:Regular Rate:Normal     Neuro/Psych negative neurological ROS  negative psych ROS   GI/Hepatic negative GI ROS, Neg liver ROS,   Endo/Other  negative endocrine ROS  Renal/GU negative Renal ROS  negative genitourinary   Musculoskeletal negative musculoskeletal ROS (+)   Abdominal   Peds negative pediatric ROS (+)  Hematology negative hematology ROS (+)   Anesthesia Other Findings   Reproductive/Obstetrics negative OB ROS                             Anesthesia Physical Anesthesia Plan  ASA: II  Anesthesia Plan: General   Post-op Pain Management:    Induction: Intravenous  Airway Management Planned: LMA  Additional Equipment:   Intra-op Plan:   Post-operative Plan: Extubation in OR  Informed Consent: I have reviewed the patients History and Physical, chart, labs and discussed the procedure including the risks, benefits and alternatives for the proposed anesthesia with the patient or authorized representative who has indicated his/her understanding and acceptance.   Dental advisory given  Plan Discussed with: CRNA and Surgeon  Anesthesia Plan Comments:         Anesthesia Quick Evaluation  

## 2016-02-10 ENCOUNTER — Encounter (HOSPITAL_BASED_OUTPATIENT_CLINIC_OR_DEPARTMENT_OTHER): Payer: Self-pay | Admitting: Orthopedic Surgery

## 2016-05-10 ENCOUNTER — Other Ambulatory Visit: Payer: Self-pay | Admitting: Physician Assistant

## 2016-05-14 ENCOUNTER — Ambulatory Visit (INDEPENDENT_AMBULATORY_CARE_PROVIDER_SITE_OTHER): Payer: BLUE CROSS/BLUE SHIELD | Admitting: Family Medicine

## 2016-05-14 ENCOUNTER — Encounter: Payer: Self-pay | Admitting: Family Medicine

## 2016-05-14 VITALS — BP 121/74 | HR 72 | Temp 97.3°F | Ht 73.0 in | Wt 206.0 lb

## 2016-05-14 DIAGNOSIS — I10 Essential (primary) hypertension: Secondary | ICD-10-CM

## 2016-05-14 MED ORDER — AMLODIPINE BESY-BENAZEPRIL HCL 10-20 MG PO CAPS: ORAL_CAPSULE | ORAL | Status: DC

## 2016-05-14 NOTE — Progress Notes (Signed)
   Subjective:    Patient ID: William Lutz, male    DOB: 05-28-1950, 66 y.o.   MRN: [id number]  HPI Pt here for follow up and management of chronic medical problems which includes hypertension. He is taking medications regularly. Patient has no complaints today. 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      Patient Active Problem List   Diagnosis Date Noted  . Essential hypertension 04/11/2015   Outpatient Encounter Prescriptions as of 05/14/2016  Medication Sig  . amLODipine-benazepril (LOTREL) 10-20 MG capsule TAKE (1) CAPSULE BY MOUTH ONCE DAILY.  Marland Kitchen aspirin 81 MG tablet Take 81 mg by mouth daily.  . Multiple Vitamin (MULTIVITAMIN WITH MINERALS) TABS tablet Take 1 tablet by mouth daily.  . niacin 250 MG tablet Take 250 mg by mouth at bedtime.  . Omega-3 Fatty Acids (FISH OIL) 1000 MG CAPS Take 5 capsules by mouth daily.   Marland Kitchen OVER THE COUNTER MEDICATION Vit A and Vit D OTC , Herbal Supplement  . [DISCONTINUED] HYDROcodone-acetaminophen (NORCO) 5-325 MG tablet 1-2 tabs po q6 hours prn pain   No facility-administered encounter medications on file as of 05/14/2016.     Review of Systems  Constitutional: Negative.   HENT: Negative.   Eyes: Negative.   Respiratory: Negative.   Cardiovascular: Negative.   Gastrointestinal: Negative.   Endocrine: Negative.   Genitourinary: Negative.   Musculoskeletal: Negative.   Skin: Negative.   Allergic/Immunologic: Negative.   Neurological: Negative.   Hematological: Negative.   Psychiatric/Behavioral: Negative.        Objective:   Physical Exam  Constitutional: He is oriented to person, place, and time. He appears well-developed and well-nourished.  Cardiovascular: Normal rate, regular rhythm and normal heart sounds.   Pulmonary/Chest: Effort normal and breath sounds normal.  Neurological: He is alert and oriented to person, place, and time.  Psychiatric: He has a normal mood  and affect. His behavior is normal.   BP 121/74 mmHg  Pulse 72  Temp(Src) 97.3 F (36.3 C) (Oral)  Ht '6\' 1"'$  (1.854 m)  Wt 206 lb (93.441 kg)  BMI 27.18 kg/m2        Assessment & Plan:  1. Essential hypertension Pressure is well controlled on amlodipine. Lipids were last assessed one year ago. LDL was less than 100 but HDL was low - Lipid panel - CMP14+EGFR  Wardell Honour MD

## 2016-05-14 NOTE — Patient Instructions (Signed)
Medicare Annual Wellness Visit  [place] and the medical providers at [facility] strive to bring you the best medical care.  In doing so we not only want to address your current medical conditions and concerns but also to detect new conditions early and prevent illness, disease and health-related problems.    Medicare offers a yearly Wellness Visit which allows our clinical staff to assess your need for preventative services including immunizations, lifestyle education, counseling to decrease risk of preventable diseases and screening for fall risk and other medical concerns.    This visit is provided free of charge (no copay) for all Medicare recipients. The clinical pharmacists at [facility] have begun to conduct these Wellness Visits which will also include a thorough review of all your medications.    As you primary medical provider recommend that you make an appointment for your Annual Wellness Visit if you have not done so already this year.  You may set up this appointment before you leave today or you may call back ([phone]) and schedule an appointment.  Please make sure when you call that you mention that you are scheduling your Annual Wellness Visit with the clinical pharmacist so that the appointment may be made for the proper length of time.     Continue current medications. Continue good therapeutic lifestyle changes which include good diet and exercise. Fall precautions discussed with patient. If an FOBT was given today- please return it to our front desk. If you are over 50 years old - you may need Prevnar 13 or the adult Pneumonia vaccine.  **Flu shots are available--- please call and schedule a FLU-CLINIC appointment**  After your visit with us today you will receive a survey in the mail or online from [employer] regarding your care with us. Please take a moment to fill this out. Your feedback is very  important to us as you can help us better understand your patient needs as well as improve your experience and satisfaction. WE CARE ABOUT YOU!!!    

## 2016-05-15 LAB — CMP14+EGFR
A/G RATIO: 1.7 (ref 1.2–2.2)
ALK PHOS: 18 IU/L — AB (ref 39–117)
ALT: 34 IU/L (ref 0–44)
AST: 22 IU/L (ref 0–40)
Albumin: 4.3 g/dL (ref 3.6–4.8)
BILIRUBIN TOTAL: 0.4 mg/dL (ref 0.0–1.2)
BUN/Creatinine Ratio: 15 (ref 10–24)
BUN: 15 mg/dL (ref 8–27)
CALCIUM: 9.3 mg/dL (ref 8.6–10.2)
CHLORIDE: 102 mmol/L (ref 96–106)
CO2: 23 mmol/L (ref 18–29)
Creatinine, Ser: 1.01 mg/dL (ref 0.76–1.27)
GFR calc Af Amer: 90 mL/min/{1.73_m2} (ref >59)
GFR, EST NON AFRICAN AMERICAN: 78 mL/min/{1.73_m2} (ref >59)
GLOBULIN, TOTAL: 2.6 g/dL (ref 1.5–4.5)
Glucose: 94 mg/dL (ref 65–99)
POTASSIUM: 4.4 mmol/L (ref 3.5–5.2)
SODIUM: 143 mmol/L (ref 134–144)
Total Protein: 6.9 g/dL (ref 6.0–8.5)

## 2016-05-15 LAB — LIPID PANEL
CHOL/HDL RATIO: 4.4 ratio (ref 0.0–5.0)
CHOLESTEROL TOTAL: 137 mg/dL (ref 100–199)
HDL: 31 mg/dL — AB (ref >39)
LDL CALC: 75 mg/dL (ref 0–99)
TRIGLYCERIDES: 154 mg/dL — AB (ref 0–149)
VLDL CHOLESTEROL CAL: 31 mg/dL (ref 5–40)

## 2016-06-08 ENCOUNTER — Other Ambulatory Visit: Payer: Self-pay | Admitting: Orthopedic Surgery

## 2016-06-25 ENCOUNTER — Encounter (HOSPITAL_BASED_OUTPATIENT_CLINIC_OR_DEPARTMENT_OTHER): Admission: RE | Payer: Self-pay | Source: Ambulatory Visit

## 2016-06-25 ENCOUNTER — Ambulatory Visit (HOSPITAL_BASED_OUTPATIENT_CLINIC_OR_DEPARTMENT_OTHER)
Admission: RE | Admit: 2016-06-25 | Payer: BLUE CROSS/BLUE SHIELD | Source: Ambulatory Visit | Admitting: Orthopedic Surgery

## 2016-06-25 SURGERY — CARPAL TUNNEL RELEASE
Anesthesia: Choice | Laterality: Left

## 2017-03-19 ENCOUNTER — Encounter: Payer: Self-pay | Admitting: Family Medicine

## 2017-03-19 ENCOUNTER — Ambulatory Visit (INDEPENDENT_AMBULATORY_CARE_PROVIDER_SITE_OTHER): Payer: BLUE CROSS/BLUE SHIELD | Admitting: Family Medicine

## 2017-03-19 ENCOUNTER — Other Ambulatory Visit: Payer: Self-pay | Admitting: Family Medicine

## 2017-03-19 VITALS — BP 135/88 | HR 76 | Temp 98.1°F | Ht 73.0 in | Wt 208.0 lb

## 2017-03-19 DIAGNOSIS — J209 Acute bronchitis, unspecified: Secondary | ICD-10-CM

## 2017-03-19 MED ORDER — AMOXICILLIN-POT CLAVULANATE 875-125 MG PO TABS: 1.0000 | ORAL_TABLET | Freq: Two times a day (BID) | ORAL | 0 refills | Status: DC

## 2017-03-19 NOTE — Patient Instructions (Signed)
Great to meet you!  Start augmentin today  Come back with any concerns.    Acute Bronchitis, Adult Acute bronchitis is when air tubes (bronchi) in the lungs suddenly get swollen. The condition can make it hard to breathe. It can also cause these symptoms:  A cough.  Coughing up clear, yellow, or green mucus.  Wheezing.  Chest congestion.  Shortness of breath.  A fever.  Body aches.  Chills.  A sore throat. Follow these instructions at home: Medicines   Take over-the-counter and prescription medicines only as told by your doctor.  If you were prescribed an antibiotic medicine, take it as told by your doctor. Do not stop taking the antibiotic even if you start to feel better. General instructions   Rest.  Drink enough fluids to keep your pee (urine) clear or pale yellow.  Avoid smoking and secondhand smoke. If you smoke and you need help quitting, ask your doctor. Quitting will help your lungs heal faster.  Use an inhaler, cool mist vaporizer, or humidifier as told by your doctor.  Keep all follow-up visits as told by your doctor. This is important. How is this prevented? To lower your risk of getting this condition again:  Wash your hands often with soap and water. If you cannot use soap and water, use hand sanitizer.  Avoid contact with people who have cold symptoms.  Try not to touch your hands to your mouth, nose, or eyes.  Make sure to get the flu shot every year. Contact a doctor if:  Your symptoms do not get better in 2 weeks. Get help right away if:  You cough up blood.  You have chest pain.  You have very bad shortness of breath.  You become dehydrated.  You faint (pass out) or keep feeling like you are going to pass out.  You keep throwing up (vomiting).  You have a very bad headache.  Your fever or chills gets worse. This information is not intended to replace advice given to you by your health care provider. Make sure you discuss any  questions you have with your health care provider. Document Released: 06/04/2008 Document Revised: 07/25/2016 Document Reviewed: 06/06/2016 Elsevier Interactive Patient Education  2017 [employer].

## 2017-03-19 NOTE — Progress Notes (Signed)
   HPI  Patient presents today here with cough.  Patient explains that for the last 7 days he's had productive cough, chest congestion, and mild shortness of breath. Patient states that about day 5 he improved and thought that he was completely better, however the last 2 days he's woken up with worsening symptoms.  He does have malaise.  He has a history of asthma, however he stopped treating it around 9 years ago when his symptoms improved.  He has mild dyspnea.  No chest pain. Subjective fever last Wednesday, now resolved.  PMH: Smoking status noted ROS: Per HPI  Objective: BP 135/88   Pulse 76   Temp 98.1 F (36.7 C) (Oral)   Ht 6\' 1"  (1.854 m)   Wt 208 lb (94.3 kg)   BMI 27.44 kg/m  Gen: NAD, alert, cooperative with exam HEENT: NCAT, TMs normal bilaterally, sinuses nontender bilaterally, oropharynx clear, nares clear CV: RRR, good S1/S2, no murmur Resp: CTABL, no wheezes, non-labored Ext: No edema, warm Neuro: Alert and oriented, No gross deficits  Assessment and plan:  # Acute bronchitis Considering improvement transiently and then worsening on concern for developing pneumonia. Treat with Augmentin Continue over-the-counter medications-Mucinex Low threshold for follow-up if symptoms worsen or do not improve.    Meds ordered this encounter  Medications  . amoxicillin-clavulanate (AUGMENTIN) 875-125 MG tablet    Sig: Take 1 tablet by mouth 2 (two) times daily.    Dispense:  20 tablet    Refill:  0    Laroy Apple, MD [facility] 03/19/2017, 1:08 PM

## 2017-04-04 ENCOUNTER — Encounter: Payer: Self-pay | Admitting: Family Medicine

## 2017-04-04 ENCOUNTER — Ambulatory Visit (INDEPENDENT_AMBULATORY_CARE_PROVIDER_SITE_OTHER): Payer: Medicare HMO | Admitting: Family Medicine

## 2017-04-04 VITALS — BP 135/84 | HR 76 | Temp 97.6°F | Ht 73.0 in | Wt 203.0 lb

## 2017-04-04 DIAGNOSIS — I10 Essential (primary) hypertension: Secondary | ICD-10-CM | POA: Diagnosis not present

## 2017-04-04 DIAGNOSIS — R69 Illness, unspecified: Secondary | ICD-10-CM | POA: Diagnosis not present

## 2017-04-04 DIAGNOSIS — Z114 Encounter for screening for human immunodeficiency virus [HIV]: Secondary | ICD-10-CM

## 2017-04-04 DIAGNOSIS — Z1159 Encounter for screening for other viral diseases: Secondary | ICD-10-CM | POA: Diagnosis not present

## 2017-04-04 DIAGNOSIS — L400 Psoriasis vulgaris: Secondary | ICD-10-CM | POA: Diagnosis not present

## 2017-04-04 MED ORDER — AMLODIPINE BESY-BENAZEPRIL HCL 10-20 MG PO CAPS: ORAL_CAPSULE | ORAL | 3 refills | Status: DC

## 2017-04-04 NOTE — Progress Notes (Signed)
BP 135/84   Pulse 76   Temp 97.6 F (36.4 C) (Oral)   Ht '6\' 1"'$  (1.854 m)   Wt 203 lb (92.1 kg)   BMI 26.78 kg/m    Subjective:    Patient ID: William Lutz, male    DOB: 1950/05/29, 67 y.o.   MRN: [id number]  HPI: Kiondre Grenz is a 67 y.o. male presenting on 04/04/2017 for Hypertension (followup)   HPI Hypertension  Patient is coming in for hypertension recheck today. His blood pressure is 135/84. He is currently on amlodipine and has appropriate. Patient denies headaches, blurred vision, chest pains, shortness of breath, or weakness. Denies any side effects from medication and is content with current medication.   Relevant past medical, surgical, family and social history reviewed and updated as indicated. Interim medical history since our last visit reviewed. Allergies and medications reviewed and updated.  Review of Systems  Constitutional: Negative for chills and fever.  Respiratory: Negative for shortness of breath and wheezing.   Cardiovascular: Negative for chest pain and leg swelling.  Musculoskeletal: Negative for back pain and gait problem.  Skin: Negative for rash.  Neurological: Negative for dizziness, weakness, light-headedness, numbness and headaches.  All other systems reviewed and are negative.   Per HPI unless specifically indicated above     Objective:    BP 135/84   Pulse 76   Temp 97.6 F (36.4 C) (Oral)   Ht '6\' 1"'$  (1.854 m)   Wt 203 lb (92.1 kg)   BMI 26.78 kg/m   Wt Readings from Last 3 Encounters:  04/04/17 203 lb (92.1 kg)  03/19/17 208 lb (94.3 kg)  05/14/16 206 lb (93.4 kg)    Physical Exam  Constitutional: He is oriented to person, place, and time. He appears well-developed and well-nourished. No distress.  Eyes: Conjunctivae are normal. No scleral icterus.  Neck: Neck supple. No thyromegaly present.  Cardiovascular: Normal rate, regular rhythm, normal heart sounds and intact distal pulses.   No murmur heard. Pulmonary/Chest: Effort  normal and breath sounds normal. No respiratory distress. He has no wheezes. He has no rales.  Musculoskeletal: Normal range of motion. He exhibits no edema.  Lymphadenopathy:    He has no cervical adenopathy.  Neurological: He is alert and oriented to person, place, and time. Coordination normal.  Skin: Skin is warm and dry. No rash noted. He is not diaphoretic.  Psychiatric: He has a normal mood and affect. His behavior is normal.  Nursing note and vitals reviewed.     Assessment & Plan:   Problem List Items Addressed This Visit      Cardiovascular and Mediastinum   Essential hypertension - Primary   Relevant Medications   amLODipine-benazepril (LOTREL) 10-20 MG capsule   Other Relevant Orders   CMP14+EGFR   Lipid panel    Other Visit Diagnoses    Need for hepatitis C screening test       Relevant Orders   Hepatitis C antibody   Screening for HIV without presence of risk factors       Relevant Orders   HIV antibody       Follow up plan: Return in about 1 year (around 04/04/2018), or if symptoms worsen or fail to improve.  Counseling provided for all of the vaccine components Orders Placed This Encounter  Procedures  . CMP14+EGFR  . Lipid panel  . HIV antibody  . Hepatitis C antibody    Caryl Pina, MD [facility] 04/04/2017, 9:43 AM

## 2017-05-13 ENCOUNTER — Other Ambulatory Visit: Payer: Medicare HMO

## 2017-05-13 DIAGNOSIS — Z114 Encounter for screening for human immunodeficiency virus [HIV]: Secondary | ICD-10-CM

## 2017-05-13 DIAGNOSIS — Z1159 Encounter for screening for other viral diseases: Secondary | ICD-10-CM

## 2017-05-13 DIAGNOSIS — I10 Essential (primary) hypertension: Secondary | ICD-10-CM | POA: Diagnosis not present

## 2017-05-13 DIAGNOSIS — R69 Illness, unspecified: Secondary | ICD-10-CM | POA: Diagnosis not present

## 2017-05-14 LAB — CMP14+EGFR
A/G RATIO: 1.6 (ref 1.2–2.2)
ALBUMIN: 4.5 g/dL (ref 3.6–4.8)
ALT: 28 IU/L (ref 0–44)
AST: 21 IU/L (ref 0–40)
Alkaline Phosphatase: 19 IU/L — ABNORMAL LOW (ref 39–117)
BILIRUBIN TOTAL: 0.4 mg/dL (ref 0.0–1.2)
BUN/Creatinine Ratio: 22 (ref 10–24)
BUN: 21 mg/dL (ref 8–27)
CHLORIDE: 102 mmol/L (ref 96–106)
CO2: 26 mmol/L (ref 18–29)
Calcium: 9.9 mg/dL (ref 8.6–10.2)
Creatinine, Ser: 0.97 mg/dL (ref 0.76–1.27)
GFR calc non Af Amer: 82 mL/min/{1.73_m2} (ref >59)
GFR, EST AFRICAN AMERICAN: 94 mL/min/{1.73_m2} (ref >59)
GLUCOSE: 100 mg/dL — AB (ref 65–99)
Globulin, Total: 2.8 g/dL (ref 1.5–4.5)
POTASSIUM: 4.3 mmol/L (ref 3.5–5.2)
Sodium: 140 mmol/L (ref 134–144)
TOTAL PROTEIN: 7.3 g/dL (ref 6.0–8.5)

## 2017-05-14 LAB — LIPID PANEL
CHOLESTEROL TOTAL: 155 mg/dL (ref 100–199)
Chol/HDL Ratio: 4.4 ratio (ref 0.0–5.0)
HDL: 35 mg/dL — AB (ref >39)
LDL Calculated: 94 mg/dL (ref 0–99)
Triglycerides: 131 mg/dL (ref 0–149)
VLDL CHOLESTEROL CAL: 26 mg/dL (ref 5–40)

## 2017-05-14 LAB — HEPATITIS C ANTIBODY

## 2017-05-14 LAB — HIV ANTIBODY (ROUTINE TESTING W REFLEX): HIV SCREEN 4TH GENERATION: NONREACTIVE

## 2017-05-28 ENCOUNTER — Telehealth: Payer: Self-pay | Admitting: *Deleted

## 2017-05-28 DIAGNOSIS — R739 Hyperglycemia, unspecified: Secondary | ICD-10-CM

## 2017-05-28 NOTE — Telephone Encounter (Signed)
It was supposed to be added, if he does not mind then have him come in and do a fingerstick to get this checked, if not we can wait until the next time he has labs because his glucose was borderline at 100 even

## 2017-05-28 NOTE — Telephone Encounter (Signed)
pt aware to come in - -ordered test

## 2017-07-29 ENCOUNTER — Ambulatory Visit (INDEPENDENT_AMBULATORY_CARE_PROVIDER_SITE_OTHER): Payer: Medicare HMO | Admitting: Family Medicine

## 2017-07-29 ENCOUNTER — Encounter: Payer: Self-pay | Admitting: Family Medicine

## 2017-07-29 VITALS — BP 129/83 | HR 72 | Temp 97.8°F | Ht 73.0 in | Wt 200.0 lb

## 2017-07-29 DIAGNOSIS — J441 Chronic obstructive pulmonary disease with (acute) exacerbation: Secondary | ICD-10-CM

## 2017-07-29 MED ORDER — AZITHROMYCIN 250 MG PO TABS: ORAL_TABLET | ORAL | 0 refills | Status: DC

## 2017-07-29 MED ORDER — ALBUTEROL SULFATE HFA 108 (90 BASE) MCG/ACT IN AERS: 2.0000 | INHALATION_SPRAY | Freq: Four times a day (QID) | RESPIRATORY_TRACT | 0 refills | Status: AC | PRN

## 2017-07-29 NOTE — Progress Notes (Signed)
BP 129/83   Pulse 72   Temp 97.8 F (36.6 C) (Oral)   Ht 6\' 1"  (1.854 m)   Wt 200 lb (90.7 kg)   BMI 26.39 kg/m    Subjective:    Patient ID: William Lutz, male    DOB: August 23, 1950, 67 y.o.   MRN: [id number]  HPI: William Lutz is a 67 y.o. male presenting on 07/29/2017 for 6 days of a gradually worsening dry cough. He can bring up clear mucus if he forces a heavy cough, and reports increased sputum production in the morning. His cough is worse at night and occasionally wakes him from sleep. He has tried zinc tablets and Airborne without relief. 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 He reports an extensive history of smoking mariajuana, and Asthma/COPD in the past for which he took Advair and Albuterol. He was treated for bronchitis 2 times last summer with ABX.   HPI  Relevant past medical, surgical, family and social history reviewed and updated as indicated. Interim medical history since our last visit reviewed. Allergies and medications reviewed and updated.   Review of Systems  Constitutional: Negative for appetite change, chills, fatigue and fever.  HENT: Positive for congestion. Negative for sinus pressure, sneezing and sore throat.   Eyes: Negative for pain, redness and visual disturbance.  Respiratory: Positive for cough. Negative for apnea, shortness of breath and wheezing.   Gastrointestinal: Negative for abdominal distention, abdominal pain, constipation, diarrhea, nausea and vomiting.  Musculoskeletal: Negative for arthralgias, back pain, joint swelling and myalgias.  Neurological: Negative for dizziness, facial asymmetry, light-headedness and headaches.  Psychiatric/Behavioral: Negative for agitation, behavioral problems and confusion.  All other systems reviewed and are negative.   Per HPI unless specifically indicated above   Allergies as of 07/29/2017   No Known Allergies     Medication List         Accurate as of 07/29/17  1:19 PM. Always use your most recent med list.          amLODipine-benazepril 10-20 MG capsule Commonly known as:  LOTREL TAKE (1) CAPSULE BY MOUTH ONCE DAILY.   aspirin 81 MG tablet Take 81 mg by mouth daily.   Fish Oil 1000 MG Caps Take 5 capsules by mouth daily.   magnesium 30 MG tablet Take 30 mg by mouth 2 (two) times daily.   multivitamin with minerals Tabs tablet Take 1 tablet by mouth daily.   niacin 250 MG tablet Take 250 mg by mouth at bedtime.   OVER THE COUNTER MEDICATION Vit A and Vit D OTC , Herbal Supplement          Objective:    BP 129/83   Pulse 72   Temp 97.8 F (36.6 C) (Oral)   Ht 6\' 1"  (1.854 m)   Wt 200 lb (90.7 kg)   BMI 26.39 kg/m   Wt Readings from Last 3 Encounters:  07/29/17 200 lb (90.7 kg)  04/04/17 203 lb (92.1 kg)  03/19/17 208 lb (94.3 kg)    Physical Exam  Constitutional: He is oriented to person, place, and time. He appears well-developed and well-nourished. No distress.  HENT:  Head: Normocephalic and atraumatic.  Mouth/Throat: Oropharynx is clear and moist. No oropharyngeal exudate.  Eyes: Pupils are equal, round, and reactive to light. Conjunctivae and EOM are normal. Right eye exhibits no discharge. Left eye exhibits no discharge. No scleral icterus.  Neck: Normal range of motion. Neck supple.  No thyromegaly present.  Cardiovascular: Normal rate, regular rhythm and normal heart sounds.   No murmur heard. Pulmonary/Chest: Effort normal and breath sounds normal. No respiratory distress. He has no wheezes. He has no rales.  Abdominal: Soft. Bowel sounds are normal. He exhibits no distension. There is no tenderness. There is no rebound and no guarding.  Neurological: He is alert and oriented to person, place, and time. He has normal reflexes. He displays normal reflexes. Coordination normal.  Skin: Skin is warm and dry. No rash noted. He is not diaphoretic.  Psychiatric: He has a normal mood and  affect. His behavior is normal. Judgment and thought content normal.  Nursing note and vitals reviewed.       Assessment & Plan:   Problem List Items Addressed This Visit    None    Visit Diagnoses    COPD with exacerbation ([place])    -  Primary   Relevant Medications   azithromycin (ZITHROMAX) 250 MG tablet   albuterol (PROVENTIL HFA;VENTOLIN HFA) 108 (90 Base) MCG/ACT inhaler      Victormanuel Lutz is a 67 y.o. male presenting on 07/29/2017 for 6 days of a gradually worsening dry cough. Given his history of heavy mariajuana smoking and recurrent episodes of acute bronchitis this is likely an exacerbation of early COPD. I will place him on a Zithromax and order a rescue inhaler for use when he has unrelenting coughing fits.   Follow up plan: No need to follow up. I instructed him to call if his condition does not improve with the Zithromax.   Written by Alta Corning, MS3   Patient seen and examined with Brock Bad medical student. Agree with assessment and plan above. Caryl Pina, MD [facility] 07/29/2017, 1:19 PM

## 2017-09-03 DIAGNOSIS — L409 Psoriasis, unspecified: Secondary | ICD-10-CM | POA: Diagnosis not present

## 2017-09-03 DIAGNOSIS — R600 Localized edema: Secondary | ICD-10-CM | POA: Diagnosis not present

## 2017-09-03 DIAGNOSIS — M79642 Pain in left hand: Secondary | ICD-10-CM | POA: Diagnosis not present

## 2017-09-03 DIAGNOSIS — Z0000 Encounter for general adult medical examination without abnormal findings: Secondary | ICD-10-CM | POA: Diagnosis not present

## 2017-09-03 DIAGNOSIS — Z7982 Long term (current) use of aspirin: Secondary | ICD-10-CM | POA: Diagnosis not present

## 2017-09-03 DIAGNOSIS — I10 Essential (primary) hypertension: Secondary | ICD-10-CM | POA: Diagnosis not present

## 2017-09-03 DIAGNOSIS — G629 Polyneuropathy, unspecified: Secondary | ICD-10-CM | POA: Diagnosis not present

## 2017-09-03 DIAGNOSIS — M79641 Pain in right hand: Secondary | ICD-10-CM | POA: Diagnosis not present

## 2017-09-03 DIAGNOSIS — Z87891 Personal history of nicotine dependence: Secondary | ICD-10-CM | POA: Diagnosis not present

## 2017-09-12 DIAGNOSIS — Z0101 Encounter for examination of eyes and vision with abnormal findings: Secondary | ICD-10-CM | POA: Diagnosis not present

## 2017-11-19 DIAGNOSIS — R69 Illness, unspecified: Secondary | ICD-10-CM | POA: Diagnosis not present

## 2018-05-27 ENCOUNTER — Encounter: Payer: Self-pay | Admitting: Family Medicine

## 2018-05-27 ENCOUNTER — Ambulatory Visit: Payer: Medicare HMO | Admitting: Family Medicine

## 2018-05-27 ENCOUNTER — Ambulatory Visit (INDEPENDENT_AMBULATORY_CARE_PROVIDER_SITE_OTHER): Payer: Medicare HMO | Admitting: Family Medicine

## 2018-05-27 VITALS — BP 116/70 | HR 70 | Temp 97.6°F | Ht 73.0 in | Wt 191.0 lb

## 2018-05-27 DIAGNOSIS — R7303 Prediabetes: Secondary | ICD-10-CM

## 2018-05-27 DIAGNOSIS — I10 Essential (primary) hypertension: Secondary | ICD-10-CM | POA: Diagnosis not present

## 2018-05-27 DIAGNOSIS — E663 Overweight: Secondary | ICD-10-CM | POA: Diagnosis not present

## 2018-05-27 DIAGNOSIS — J40 Bronchitis, not specified as acute or chronic: Secondary | ICD-10-CM

## 2018-05-27 DIAGNOSIS — R69 Illness, unspecified: Secondary | ICD-10-CM | POA: Diagnosis not present

## 2018-05-27 MED ORDER — AMLODIPINE BESY-BENAZEPRIL HCL 10-20 MG PO CAPS: ORAL_CAPSULE | ORAL | 3 refills | Status: DC

## 2018-05-27 MED ORDER — PREDNISONE 20 MG PO TABS: ORAL_TABLET | ORAL | 0 refills | Status: DC

## 2018-05-27 NOTE — Progress Notes (Signed)
BP 116/70 (BP Location: Right Arm)   Pulse 70   Temp 97.6 F (36.4 C) (Oral)   Ht _0  (1.854 m)   Wt 191 lb (86.6 kg)   BMI 25.20 kg/m    Subjective:    Patient ID: William Lutz, male    DOB: August 12, 1950, 68 y.o.   MRN: [id number]  HPI: William Lutz is a 68 y.o. male presenting on 05/27/2018 for Hypertension (yearly )   HPI Hypertension Patient is currently on amlodipine-benazepril, and their blood pressure today is 116/70. Patient denies any lightheadedness or dizziness. Patient denies headaches, blurred vision, chest pains, shortness of breath, or weakness. Denies any side effects from medication and is content with current medication.   Prediabetes Patient comes in today for recheck of his diabetes. Patient has been currently taking diet control, we are monitoring for now. Patient is currently on an ACE inhibitor/ARB. Patient has not seen an ophthalmologist this year. Patient denies any issues with their feet.    Sinus congestion and chest congestion Cough and sinus congestion and head congestion that has been going on for the past 1 week.  He has used some over-the-counter Tylenol and sinus medication.  He has not been wheezing but he feels like he is starting to have that direction.  He keeps his albuterol but has not felt to need it yet but feels like he may need it soon.  He denies any fevers or chills or shortness of breath.  He says it may have all started when he was exposed to some machinery and dust at his workplace.  He has started wearing masks to help prevent this.  Relevant past medical, surgical, family and social history reviewed and updated as indicated. Interim medical history since our last visit reviewed. Allergies and medications reviewed and updated.  Review of Systems  Constitutional: Negative for chills and fever.  HENT: Positive for congestion, postnasal drip, rhinorrhea, sinus pressure, sneezing and sore throat. Negative for ear discharge, ear pain and  voice change.   Eyes: Negative for pain, discharge, redness and visual disturbance.  Respiratory: Positive for cough. Negative for shortness of breath and wheezing.   Cardiovascular: Negative for chest pain and leg swelling.  Musculoskeletal: Negative for gait problem.  Skin: Negative for rash.  Neurological: Negative for dizziness, weakness, light-headedness and numbness.  All other systems reviewed and are negative.   Per HPI unless specifically indicated above   Allergies as of 05/27/2018   No Known Allergies     Medication List        Accurate as of 05/27/18  9:06 AM. Always use your most recent med list.          albuterol 108 (90 Base) MCG/ACT inhaler Commonly known as:  PROVENTIL HFA;VENTOLIN HFA Inhale 2 puffs into the lungs every 6 (six) hours as needed for wheezing or shortness of breath.   amLODipine-benazepril 10-20 MG capsule Commonly known as:  LOTREL TAKE (1) CAPSULE BY MOUTH ONCE DAILY.   aspirin 81 MG tablet Take 81 mg by mouth daily.   Fish Oil 1000 MG Caps Take 5 capsules by mouth daily.   magnesium 30 MG tablet Take 30 mg by mouth 2 (two) times daily.   multivitamin with minerals Tabs tablet Take 1 tablet by mouth daily.   niacin 250 MG tablet Take 250 mg by mouth at bedtime.   OVER THE COUNTER MEDICATION Vit A and Vit D OTC , Herbal Supplement  Objective:    BP 116/70 (BP Location: Right Arm)   Pulse 70   Temp 97.6 F (36.4 C) (Oral)   Ht _0  (1.854 m)   Wt 191 lb (86.6 kg)   BMI 25.20 kg/m   Wt Readings from Last 3 Encounters:  05/27/18 191 lb (86.6 kg)  07/29/17 200 lb (90.7 kg)  04/04/17 203 lb (92.1 kg)    Physical Exam  Constitutional: He is oriented to person, place, and time. He appears well-developed and well-nourished. No distress.  HENT:  Right Ear: Tympanic membrane, external ear and ear canal normal.  Left Ear: Tympanic membrane, external ear and ear canal normal.  Nose: Mucosal edema and rhinorrhea  present. No sinus tenderness. No epistaxis. Right sinus exhibits maxillary sinus tenderness. Right sinus exhibits no frontal sinus tenderness. Left sinus exhibits maxillary sinus tenderness. Left sinus exhibits no frontal sinus tenderness.  Mouth/Throat: Uvula is midline and mucous membranes are normal. Posterior oropharyngeal edema and posterior oropharyngeal erythema present. No oropharyngeal exudate or tonsillar abscesses.  Eyes: Conjunctivae are normal. No scleral icterus.  Neck: Neck supple. No thyromegaly present.  Cardiovascular: Normal rate, regular rhythm, normal heart sounds and intact distal pulses.  No murmur heard. Pulmonary/Chest: Effort normal and breath sounds normal. No respiratory distress. He has no wheezes. He has no rales.  Musculoskeletal: Normal range of motion. He exhibits no edema.  Lymphadenopathy:    He has no cervical adenopathy.  Neurological: He is alert and oriented to person, place, and time. Coordination normal.  Skin: Skin is warm and dry. No rash noted. He is not diaphoretic.  Psychiatric: He has a normal mood and affect. His behavior is normal.  Nursing note and vitals reviewed.   Results for orders placed or performed in visit on 05/13/17  CMP14+EGFR  Result Value Ref Range   Glucose 100 (H) 65 - 99 mg/dL   BUN 21 8 - 27 mg/dL   Creatinine, Ser 0.97 0.76 - 1.27 mg/dL   GFR calc non Af Amer 82 >59 mL/min/1.73   GFR calc Af Amer 94 >59 mL/min/1.73   BUN/Creatinine Ratio 22 10 - 24   Sodium 140 134 - 144 mmol/L   Potassium 4.3 3.5 - 5.2 mmol/L   Chloride 102 96 - 106 mmol/L   CO2 26 18 - 29 mmol/L   Calcium 9.9 8.6 - 10.2 mg/dL   Total Protein 7.3 6.0 - 8.5 g/dL   Albumin 4.5 3.6 - 4.8 g/dL   Globulin, Total 2.8 1.5 - 4.5 g/dL   Albumin/Globulin Ratio 1.6 1.2 - 2.2   Bilirubin Total 0.4 0.0 - 1.2 mg/dL   Alkaline Phosphatase 19 (L) 39 - 117 IU/L   AST 21 0 - 40 IU/L   ALT 28 0 - 44 IU/L  Lipid panel  Result Value Ref Range   Cholesterol, Total  155 100 - 199 mg/dL   Triglycerides 131 0 - 149 mg/dL   HDL 35 (L) >39 mg/dL   VLDL Cholesterol Cal 26 5 - 40 mg/dL   LDL Calculated 94 0 - 99 mg/dL   Chol/HDL Ratio 4.4 0.0 - 5.0 ratio  HIV antibody  Result Value Ref Range   HIV Screen 4th Generation wRfx Non Reactive Non Reactive  Hepatitis C antibody  Result Value Ref Range   Hep C Virus Ab <0.1 0.0 - 0.9 s/co ratio      Assessment & Plan:   Problem List Items Addressed This Visit      Cardiovascular and Mediastinum  Essential hypertension - Primary   Relevant Orders   CMP14+EGFR   CBC with Differential/Platelet     Other   Prediabetes   Relevant Orders   Bayer DCA Hb A1c Waived   CBC with Differential/Platelet   Overweight (BMI 25.0-29.9)   Relevant Orders   Lipid panel       Follow up plan: Return in about 1 year (around 05/28/2019), or if symptoms worsen or fail to improve, for Recheck prediabetes and hypertension.  Counseling provided for all of the vaccine components Orders Placed This Encounter  Procedures  . Bayer DCA Hb A1c Waived  . CMP14+EGFR  . Lipid panel  . CBC with Differential/Platelet    Caryl Pina, MD [facility] 05/27/2018, 9:06 AM

## 2018-05-28 ENCOUNTER — Other Ambulatory Visit: Payer: Medicare HMO

## 2018-05-28 DIAGNOSIS — I10 Essential (primary) hypertension: Secondary | ICD-10-CM | POA: Diagnosis not present

## 2018-05-28 DIAGNOSIS — J40 Bronchitis, not specified as acute or chronic: Secondary | ICD-10-CM

## 2018-05-28 DIAGNOSIS — E663 Overweight: Secondary | ICD-10-CM | POA: Diagnosis not present

## 2018-05-28 DIAGNOSIS — R7303 Prediabetes: Secondary | ICD-10-CM | POA: Diagnosis not present

## 2018-05-28 LAB — BAYER DCA HB A1C WAIVED: HB A1C: 5.3 % (ref <7.0)

## 2018-05-29 LAB — CBC WITH DIFFERENTIAL/PLATELET
BASOS ABS: 0 10*3/uL (ref 0.0–0.2)
Basos: 0 %
EOS (ABSOLUTE): 0.2 10*3/uL (ref 0.0–0.4)
EOS: 3 %
HEMATOCRIT: 41.9 % (ref 37.5–51.0)
HEMOGLOBIN: 14.1 g/dL (ref 13.0–17.7)
IMMATURE GRANULOCYTES: 0 %
Immature Grans (Abs): 0 10*3/uL (ref 0.0–0.1)
LYMPHS ABS: 1.9 10*3/uL (ref 0.7–3.1)
LYMPHS: 28 %
MCH: 28 pg (ref 26.6–33.0)
MCHC: 33.7 g/dL (ref 31.5–35.7)
MCV: 83 fL (ref 79–97)
MONOCYTES: 14 %
Monocytes Absolute: 1 10*3/uL — ABNORMAL HIGH (ref 0.1–0.9)
Neutrophils Absolute: 3.8 10*3/uL (ref 1.4–7.0)
Neutrophils: 55 %
Platelets: 173 10*3/uL (ref 150–450)
RBC: 5.03 x10E6/uL (ref 4.14–5.80)
RDW: 14.2 % (ref 12.3–15.4)
WBC: 7 10*3/uL (ref 3.4–10.8)

## 2018-05-29 LAB — CMP14+EGFR
ALBUMIN: 4.4 g/dL (ref 3.6–4.8)
ALK PHOS: 23 IU/L — AB (ref 39–117)
ALT: 19 IU/L (ref 0–44)
AST: 19 IU/L (ref 0–40)
Albumin/Globulin Ratio: 1.8 (ref 1.2–2.2)
BUN/Creatinine Ratio: 26 — ABNORMAL HIGH (ref 10–24)
BUN: 25 mg/dL (ref 8–27)
Bilirubin Total: 0.2 mg/dL (ref 0.0–1.2)
CALCIUM: 9.4 mg/dL (ref 8.6–10.2)
CO2: 21 mmol/L (ref 20–29)
Chloride: 105 mmol/L (ref 96–106)
Creatinine, Ser: 0.97 mg/dL (ref 0.76–1.27)
GFR calc Af Amer: 94 mL/min/{1.73_m2} (ref >59)
GFR, EST NON AFRICAN AMERICAN: 81 mL/min/{1.73_m2} (ref >59)
GLOBULIN, TOTAL: 2.5 g/dL (ref 1.5–4.5)
Glucose: 103 mg/dL — ABNORMAL HIGH (ref 65–99)
Potassium: 4.4 mmol/L (ref 3.5–5.2)
Sodium: 141 mmol/L (ref 134–144)
Total Protein: 6.9 g/dL (ref 6.0–8.5)

## 2018-05-29 LAB — LIPID PANEL
CHOLESTEROL TOTAL: 113 mg/dL (ref 100–199)
Chol/HDL Ratio: 3.9 ratio (ref 0.0–5.0)
HDL: 29 mg/dL — ABNORMAL LOW (ref >39)
LDL CALC: 60 mg/dL (ref 0–99)
Triglycerides: 122 mg/dL (ref 0–149)
VLDL CHOLESTEROL CAL: 24 mg/dL (ref 5–40)

## 2018-06-16 DIAGNOSIS — H25013 Cortical age-related cataract, bilateral: Secondary | ICD-10-CM | POA: Diagnosis not present

## 2018-06-16 DIAGNOSIS — H538 Other visual disturbances: Secondary | ICD-10-CM | POA: Diagnosis not present

## 2018-06-16 DIAGNOSIS — H524 Presbyopia: Secondary | ICD-10-CM | POA: Diagnosis not present

## 2018-06-16 DIAGNOSIS — H11001 Unspecified pterygium of right eye: Secondary | ICD-10-CM | POA: Diagnosis not present

## 2018-06-16 DIAGNOSIS — H2513 Age-related nuclear cataract, bilateral: Secondary | ICD-10-CM | POA: Diagnosis not present

## 2018-07-09 DIAGNOSIS — H11152 Pinguecula, left eye: Secondary | ICD-10-CM | POA: Diagnosis not present

## 2018-07-09 DIAGNOSIS — H04123 Dry eye syndrome of bilateral lacrimal glands: Secondary | ICD-10-CM | POA: Diagnosis not present

## 2018-07-09 DIAGNOSIS — H25813 Combined forms of age-related cataract, bilateral: Secondary | ICD-10-CM | POA: Diagnosis not present

## 2018-07-09 DIAGNOSIS — H11051 Peripheral pterygium, progressive, right eye: Secondary | ICD-10-CM | POA: Diagnosis not present

## 2018-07-09 DIAGNOSIS — H52211 Irregular astigmatism, right eye: Secondary | ICD-10-CM | POA: Diagnosis not present

## 2018-07-21 ENCOUNTER — Other Ambulatory Visit: Payer: Self-pay | Admitting: Family

## 2018-07-22 ENCOUNTER — Telehealth: Payer: Self-pay | Admitting: Family Medicine

## 2018-07-22 NOTE — Telephone Encounter (Signed)
This was completed yesterday and should have been faxed today.

## 2018-07-23 NOTE — Telephone Encounter (Signed)
Left message for Olivia Mackie to contact us if she did not receive the fax we sent yesterday.

## 2018-07-29 DIAGNOSIS — H11001 Unspecified pterygium of right eye: Secondary | ICD-10-CM | POA: Diagnosis not present

## 2018-07-29 DIAGNOSIS — H11021 Central pterygium of right eye: Secondary | ICD-10-CM | POA: Diagnosis not present

## 2018-07-29 DIAGNOSIS — H11051 Peripheral pterygium, progressive, right eye: Secondary | ICD-10-CM | POA: Diagnosis not present

## 2018-08-13 DIAGNOSIS — Z7982 Long term (current) use of aspirin: Secondary | ICD-10-CM | POA: Diagnosis not present

## 2018-08-13 DIAGNOSIS — Z7722 Contact with and (suspected) exposure to environmental tobacco smoke (acute) (chronic): Secondary | ICD-10-CM | POA: Diagnosis not present

## 2018-08-13 DIAGNOSIS — I10 Essential (primary) hypertension: Secondary | ICD-10-CM | POA: Diagnosis not present

## 2018-08-13 DIAGNOSIS — Z833 Family history of diabetes mellitus: Secondary | ICD-10-CM | POA: Diagnosis not present

## 2018-08-13 DIAGNOSIS — Z8249 Family history of ischemic heart disease and other diseases of the circulatory system: Secondary | ICD-10-CM | POA: Diagnosis not present

## 2018-08-13 DIAGNOSIS — Z823 Family history of stroke: Secondary | ICD-10-CM | POA: Diagnosis not present

## 2018-12-02 DIAGNOSIS — R69 Illness, unspecified: Secondary | ICD-10-CM | POA: Diagnosis not present

## 2018-12-04 DIAGNOSIS — H25013 Cortical age-related cataract, bilateral: Secondary | ICD-10-CM | POA: Diagnosis not present

## 2018-12-04 DIAGNOSIS — H2513 Age-related nuclear cataract, bilateral: Secondary | ICD-10-CM | POA: Diagnosis not present

## 2018-12-04 DIAGNOSIS — H04123 Dry eye syndrome of bilateral lacrimal glands: Secondary | ICD-10-CM | POA: Diagnosis not present

## 2018-12-23 DIAGNOSIS — M79641 Pain in right hand: Secondary | ICD-10-CM | POA: Diagnosis not present

## 2018-12-23 DIAGNOSIS — M19041 Primary osteoarthritis, right hand: Secondary | ICD-10-CM | POA: Diagnosis not present

## 2018-12-23 DIAGNOSIS — M79642 Pain in left hand: Secondary | ICD-10-CM | POA: Diagnosis not present

## 2018-12-23 DIAGNOSIS — M19042 Primary osteoarthritis, left hand: Secondary | ICD-10-CM | POA: Diagnosis not present

## 2018-12-30 DIAGNOSIS — M19041 Primary osteoarthritis, right hand: Secondary | ICD-10-CM | POA: Diagnosis not present

## 2018-12-30 DIAGNOSIS — M7989 Other specified soft tissue disorders: Secondary | ICD-10-CM | POA: Diagnosis not present

## 2018-12-30 DIAGNOSIS — M25642 Stiffness of left hand, not elsewhere classified: Secondary | ICD-10-CM | POA: Diagnosis not present

## 2018-12-30 DIAGNOSIS — M25641 Stiffness of right hand, not elsewhere classified: Secondary | ICD-10-CM | POA: Diagnosis not present

## 2018-12-30 DIAGNOSIS — R29898 Other symptoms and signs involving the musculoskeletal system: Secondary | ICD-10-CM | POA: Diagnosis not present

## 2018-12-30 DIAGNOSIS — M19042 Primary osteoarthritis, left hand: Secondary | ICD-10-CM | POA: Diagnosis not present

## 2019-01-07 DIAGNOSIS — M25642 Stiffness of left hand, not elsewhere classified: Secondary | ICD-10-CM | POA: Diagnosis not present

## 2019-01-07 DIAGNOSIS — R29898 Other symptoms and signs involving the musculoskeletal system: Secondary | ICD-10-CM | POA: Diagnosis not present

## 2019-01-07 DIAGNOSIS — M19041 Primary osteoarthritis, right hand: Secondary | ICD-10-CM | POA: Diagnosis not present

## 2019-01-07 DIAGNOSIS — M25641 Stiffness of right hand, not elsewhere classified: Secondary | ICD-10-CM | POA: Diagnosis not present

## 2019-01-07 DIAGNOSIS — M7989 Other specified soft tissue disorders: Secondary | ICD-10-CM | POA: Diagnosis not present

## 2019-01-07 DIAGNOSIS — M19042 Primary osteoarthritis, left hand: Secondary | ICD-10-CM | POA: Diagnosis not present

## 2019-01-14 DIAGNOSIS — R29898 Other symptoms and signs involving the musculoskeletal system: Secondary | ICD-10-CM | POA: Diagnosis not present

## 2019-01-14 DIAGNOSIS — M25641 Stiffness of right hand, not elsewhere classified: Secondary | ICD-10-CM | POA: Diagnosis not present

## 2019-01-14 DIAGNOSIS — M25642 Stiffness of left hand, not elsewhere classified: Secondary | ICD-10-CM | POA: Diagnosis not present

## 2019-01-14 DIAGNOSIS — M7989 Other specified soft tissue disorders: Secondary | ICD-10-CM | POA: Diagnosis not present

## 2019-01-14 DIAGNOSIS — M19041 Primary osteoarthritis, right hand: Secondary | ICD-10-CM | POA: Diagnosis not present

## 2019-01-14 DIAGNOSIS — M19042 Primary osteoarthritis, left hand: Secondary | ICD-10-CM | POA: Diagnosis not present

## 2019-01-21 DIAGNOSIS — R29898 Other symptoms and signs involving the musculoskeletal system: Secondary | ICD-10-CM | POA: Diagnosis not present

## 2019-01-21 DIAGNOSIS — M25642 Stiffness of left hand, not elsewhere classified: Secondary | ICD-10-CM | POA: Diagnosis not present

## 2019-01-21 DIAGNOSIS — M19042 Primary osteoarthritis, left hand: Secondary | ICD-10-CM | POA: Diagnosis not present

## 2019-01-21 DIAGNOSIS — M7989 Other specified soft tissue disorders: Secondary | ICD-10-CM | POA: Diagnosis not present

## 2019-01-21 DIAGNOSIS — M19041 Primary osteoarthritis, right hand: Secondary | ICD-10-CM | POA: Diagnosis not present

## 2019-01-21 DIAGNOSIS — M25641 Stiffness of right hand, not elsewhere classified: Secondary | ICD-10-CM | POA: Diagnosis not present

## 2019-01-28 DIAGNOSIS — R29898 Other symptoms and signs involving the musculoskeletal system: Secondary | ICD-10-CM | POA: Diagnosis not present

## 2019-01-28 DIAGNOSIS — M7989 Other specified soft tissue disorders: Secondary | ICD-10-CM | POA: Diagnosis not present

## 2019-01-28 DIAGNOSIS — M19042 Primary osteoarthritis, left hand: Secondary | ICD-10-CM | POA: Diagnosis not present

## 2019-01-28 DIAGNOSIS — M25641 Stiffness of right hand, not elsewhere classified: Secondary | ICD-10-CM | POA: Diagnosis not present

## 2019-01-28 DIAGNOSIS — M19041 Primary osteoarthritis, right hand: Secondary | ICD-10-CM | POA: Diagnosis not present

## 2019-01-28 DIAGNOSIS — M25642 Stiffness of left hand, not elsewhere classified: Secondary | ICD-10-CM | POA: Diagnosis not present

## 2019-02-03 DIAGNOSIS — M19042 Primary osteoarthritis, left hand: Secondary | ICD-10-CM | POA: Diagnosis not present

## 2019-02-03 DIAGNOSIS — M25641 Stiffness of right hand, not elsewhere classified: Secondary | ICD-10-CM | POA: Diagnosis not present

## 2019-02-03 DIAGNOSIS — R29898 Other symptoms and signs involving the musculoskeletal system: Secondary | ICD-10-CM | POA: Diagnosis not present

## 2019-02-03 DIAGNOSIS — M7989 Other specified soft tissue disorders: Secondary | ICD-10-CM | POA: Diagnosis not present

## 2019-02-03 DIAGNOSIS — M19041 Primary osteoarthritis, right hand: Secondary | ICD-10-CM | POA: Diagnosis not present

## 2019-02-03 DIAGNOSIS — M25642 Stiffness of left hand, not elsewhere classified: Secondary | ICD-10-CM | POA: Diagnosis not present

## 2019-02-18 DIAGNOSIS — M25641 Stiffness of right hand, not elsewhere classified: Secondary | ICD-10-CM | POA: Diagnosis not present

## 2019-02-18 DIAGNOSIS — M19042 Primary osteoarthritis, left hand: Secondary | ICD-10-CM | POA: Diagnosis not present

## 2019-02-18 DIAGNOSIS — R29898 Other symptoms and signs involving the musculoskeletal system: Secondary | ICD-10-CM | POA: Diagnosis not present

## 2019-02-18 DIAGNOSIS — M25642 Stiffness of left hand, not elsewhere classified: Secondary | ICD-10-CM | POA: Diagnosis not present

## 2019-02-18 DIAGNOSIS — M19041 Primary osteoarthritis, right hand: Secondary | ICD-10-CM | POA: Diagnosis not present

## 2019-02-18 DIAGNOSIS — M7989 Other specified soft tissue disorders: Secondary | ICD-10-CM | POA: Diagnosis not present

## 2019-02-23 DIAGNOSIS — M25642 Stiffness of left hand, not elsewhere classified: Secondary | ICD-10-CM | POA: Diagnosis not present

## 2019-02-23 DIAGNOSIS — M19041 Primary osteoarthritis, right hand: Secondary | ICD-10-CM | POA: Diagnosis not present

## 2019-02-23 DIAGNOSIS — M7989 Other specified soft tissue disorders: Secondary | ICD-10-CM | POA: Diagnosis not present

## 2019-02-23 DIAGNOSIS — R29898 Other symptoms and signs involving the musculoskeletal system: Secondary | ICD-10-CM | POA: Diagnosis not present

## 2019-02-23 DIAGNOSIS — M25641 Stiffness of right hand, not elsewhere classified: Secondary | ICD-10-CM | POA: Diagnosis not present

## 2019-02-23 DIAGNOSIS — M19042 Primary osteoarthritis, left hand: Secondary | ICD-10-CM | POA: Diagnosis not present

## 2019-04-15 ENCOUNTER — Emergency Department (HOSPITAL_COMMUNITY)
Admission: EM | Admit: 2019-04-15 | Discharge: 2019-04-15 | Disposition: A | Discharge status: Home / Self Care | Payer: Medicare HMO | Attending: Emergency Medicine | Admitting: Emergency Medicine

## 2019-04-15 ENCOUNTER — Other Ambulatory Visit: Payer: Self-pay

## 2019-04-15 ENCOUNTER — Emergency Department (HOSPITAL_COMMUNITY): Payer: Medicare HMO

## 2019-04-15 ENCOUNTER — Encounter (HOSPITAL_COMMUNITY): Payer: Self-pay | Admitting: Emergency Medicine

## 2019-04-15 DIAGNOSIS — Z79899 Other long term (current) drug therapy: Secondary | ICD-10-CM | POA: Diagnosis not present

## 2019-04-15 DIAGNOSIS — Y9389 Activity, other specified: Secondary | ICD-10-CM | POA: Insufficient documentation

## 2019-04-15 DIAGNOSIS — R109 Unspecified abdominal pain: Secondary | ICD-10-CM | POA: Insufficient documentation

## 2019-04-15 DIAGNOSIS — J45909 Unspecified asthma, uncomplicated: Secondary | ICD-10-CM | POA: Insufficient documentation

## 2019-04-15 DIAGNOSIS — S2242XA Multiple fractures of ribs, left side, initial encounter for closed fracture: Secondary | ICD-10-CM | POA: Diagnosis not present

## 2019-04-15 DIAGNOSIS — Y92410 Unspecified street and highway as the place of occurrence of the external cause: Secondary | ICD-10-CM | POA: Insufficient documentation

## 2019-04-15 DIAGNOSIS — K4090 Unilateral inguinal hernia, without obstruction or gangrene, not specified as recurrent: Secondary | ICD-10-CM | POA: Diagnosis not present

## 2019-04-15 DIAGNOSIS — I10 Essential (primary) hypertension: Secondary | ICD-10-CM | POA: Insufficient documentation

## 2019-04-15 DIAGNOSIS — R7303 Prediabetes: Secondary | ICD-10-CM | POA: Diagnosis not present

## 2019-04-15 DIAGNOSIS — Y999 Unspecified external cause status: Secondary | ICD-10-CM | POA: Insufficient documentation

## 2019-04-15 DIAGNOSIS — Z87891 Personal history of nicotine dependence: Secondary | ICD-10-CM | POA: Diagnosis not present

## 2019-04-15 DIAGNOSIS — S299XXA Unspecified injury of thorax, initial encounter: Secondary | ICD-10-CM | POA: Diagnosis present

## 2019-04-15 LAB — BASIC METABOLIC PANEL
Anion gap: 8 (ref 5–15)
BUN: 26 mg/dL — ABNORMAL HIGH (ref 8–23)
CO2: 26 mmol/L (ref 22–32)
Calcium: 9.5 mg/dL (ref 8.9–10.3)
Chloride: 105 mmol/L (ref 98–111)
Creatinine, Ser: 0.96 mg/dL (ref 0.61–1.24)
GFR calc Af Amer: >60 mL/min (ref >60)
GFR calc non Af Amer: >60 mL/min (ref >60)
Glucose, Bld: 99 mg/dL (ref 70–99)
Potassium: 3.9 mmol/L (ref 3.5–5.1)
Sodium: 139 mmol/L (ref 135–145)

## 2019-04-15 LAB — URINALYSIS, ROUTINE W REFLEX MICROSCOPIC
Bilirubin Urine: NEGATIVE
Glucose, UA: NEGATIVE mg/dL
Hgb urine dipstick: NEGATIVE
Ketones, ur: NEGATIVE mg/dL
Leukocytes,Ua: NEGATIVE
Nitrite: NEGATIVE
Protein, ur: NEGATIVE mg/dL
Specific Gravity, Urine: 1.015 (ref 1.005–1.030)
pH: 7 (ref 5.0–8.0)

## 2019-04-15 LAB — CBC
HCT: 44.5 % (ref 39.0–52.0)
Hemoglobin: 14.6 g/dL (ref 13.0–17.0)
MCH: 28.7 pg (ref 26.0–34.0)
MCHC: 32.8 g/dL (ref 30.0–36.0)
MCV: 87.6 fL (ref 80.0–100.0)
Platelets: 165 10*3/uL (ref 150–400)
RBC: 5.08 MIL/uL (ref 4.22–5.81)
RDW: 12 % (ref 11.5–15.5)
WBC: 7.6 10*3/uL (ref 4.0–10.5)
nRBC: 0 % (ref 0.0–0.2)

## 2019-04-15 LAB — PROTIME-INR
INR: 1 (ref 0.8–1.2)
Prothrombin Time: 13.4 seconds (ref 11.4–15.2)

## 2019-04-15 MED ORDER — IOHEXOL 300 MG/ML  SOLN
100.0000 mL | Freq: Once | INTRAMUSCULAR | Status: AC | PRN
  Administered 2019-04-15: 100 mL via INTRAVENOUS

## 2019-04-15 MED ORDER — MELOXICAM 7.5 MG PO TABS: 15.0000 mg | ORAL_TABLET | Freq: Every day | ORAL | 0 refills | Status: DC
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
## 2019-05-04 ENCOUNTER — Ambulatory Visit (INDEPENDENT_AMBULATORY_CARE_PROVIDER_SITE_OTHER): Payer: Medicare HMO | Admitting: Family Medicine

## 2019-05-04 ENCOUNTER — Other Ambulatory Visit: Payer: Self-pay | Admitting: Family Medicine

## 2019-05-04 ENCOUNTER — Other Ambulatory Visit: Payer: Self-pay

## 2019-05-04 ENCOUNTER — Encounter: Payer: Self-pay | Admitting: Family Medicine

## 2019-05-04 DIAGNOSIS — T148XXA Other injury of unspecified body region, initial encounter: Secondary | ICD-10-CM | POA: Diagnosis not present

## 2019-05-04 DIAGNOSIS — S2242XD Multiple fractures of ribs, left side, subsequent encounter for fracture with routine healing: Secondary | ICD-10-CM

## 2019-05-04 DIAGNOSIS — I10 Essential (primary) hypertension: Secondary | ICD-10-CM

## 2019-05-04 DIAGNOSIS — R7303 Prediabetes: Secondary | ICD-10-CM

## 2019-05-04 DIAGNOSIS — J441 Chronic obstructive pulmonary disease with (acute) exacerbation: Secondary | ICD-10-CM

## 2019-05-04 DIAGNOSIS — R739 Hyperglycemia, unspecified: Secondary | ICD-10-CM

## 2019-05-04 MED ORDER — AMLODIPINE BESY-BENAZEPRIL HCL 10-20 MG PO CAPS: ORAL_CAPSULE | ORAL | 0 refills | Status: DC

## 2019-05-04 NOTE — Progress Notes (Signed)
Placed labs to come in before next visit Caryl Pina, MD [place] 05/04/2019, 9:20 AM
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
## 2019-06-02 ENCOUNTER — Other Ambulatory Visit: Payer: Medicare HMO

## 2019-06-02 ENCOUNTER — Other Ambulatory Visit: Payer: Self-pay

## 2019-06-02 DIAGNOSIS — R7303 Prediabetes: Secondary | ICD-10-CM | POA: Diagnosis not present

## 2019-06-02 DIAGNOSIS — J441 Chronic obstructive pulmonary disease with (acute) exacerbation: Secondary | ICD-10-CM | POA: Diagnosis not present

## 2019-06-02 DIAGNOSIS — I10 Essential (primary) hypertension: Secondary | ICD-10-CM | POA: Diagnosis not present

## 2019-06-02 DIAGNOSIS — R739 Hyperglycemia, unspecified: Secondary | ICD-10-CM

## 2019-06-02 LAB — BAYER DCA HB A1C WAIVED: HB A1C (BAYER DCA - WAIVED): 5.3 % (ref <7.0)

## 2019-06-02 LAB — LIPID PANEL

## 2019-06-03 LAB — CMP14+EGFR
ALT: 20 IU/L (ref 0–44)
AST: 18 IU/L (ref 0–40)
Albumin/Globulin Ratio: 2 (ref 1.2–2.2)
Albumin: 4.5 g/dL (ref 3.8–4.8)
Alkaline Phosphatase: 17 IU/L — ABNORMAL LOW (ref 39–117)
BUN/Creatinine Ratio: 21 (ref 10–24)
BUN: 20 mg/dL (ref 8–27)
Bilirubin Total: 0.3 mg/dL (ref 0.0–1.2)
CO2: 27 mmol/L (ref 20–29)
Calcium: 9.7 mg/dL (ref 8.6–10.2)
Chloride: 103 mmol/L (ref 96–106)
Creatinine, Ser: 0.94 mg/dL (ref 0.76–1.27)
GFR calc Af Amer: 97 mL/min/{1.73_m2} (ref >59)
GFR calc non Af Amer: 84 mL/min/{1.73_m2} (ref >59)
Globulin, Total: 2.3 g/dL (ref 1.5–4.5)
Glucose: 95 mg/dL (ref 65–99)
Potassium: 4.2 mmol/L (ref 3.5–5.2)
Sodium: 143 mmol/L (ref 134–144)
Total Protein: 6.8 g/dL (ref 6.0–8.5)

## 2019-06-03 LAB — CBC WITH DIFFERENTIAL/PLATELET
Basophils Absolute: 0 10*3/uL (ref 0.0–0.2)
Basos: 1 %
EOS (ABSOLUTE): 0.3 10*3/uL (ref 0.0–0.4)
Eos: 5 %
Hematocrit: 43.3 % (ref 37.5–51.0)
Hemoglobin: 14.1 g/dL (ref 13.0–17.7)
Immature Grans (Abs): 0 10*3/uL (ref 0.0–0.1)
Immature Granulocytes: 0 %
Lymphocytes Absolute: 1.7 10*3/uL (ref 0.7–3.1)
Lymphs: 29 %
MCH: 28.4 pg (ref 26.6–33.0)
MCHC: 32.6 g/dL (ref 31.5–35.7)
MCV: 87 fL (ref 79–97)
Monocytes Absolute: 0.7 10*3/uL (ref 0.1–0.9)
Monocytes: 13 %
Neutrophils Absolute: 3 10*3/uL (ref 1.4–7.0)
Neutrophils: 52 %
Platelets: 154 10*3/uL (ref 150–450)
RBC: 4.96 x10E6/uL (ref 4.14–5.80)
RDW: 13.5 % (ref 11.6–15.4)
WBC: 5.7 10*3/uL (ref 3.4–10.8)

## 2019-06-03 LAB — LIPID PANEL
Chol/HDL Ratio: 5.2 ratio — ABNORMAL HIGH (ref 0.0–5.0)
Cholesterol, Total: 161 mg/dL (ref 100–199)
HDL: 31 mg/dL — ABNORMAL LOW (ref >39)
LDL Calculated: 93 mg/dL (ref 0–99)
Triglycerides: 184 mg/dL — ABNORMAL HIGH (ref 0–149)
VLDL Cholesterol Cal: 37 mg/dL (ref 5–40)

## 2019-06-08 DIAGNOSIS — H524 Presbyopia: Secondary | ICD-10-CM | POA: Diagnosis not present

## 2019-06-08 DIAGNOSIS — H25813 Combined forms of age-related cataract, bilateral: Secondary | ICD-10-CM | POA: Diagnosis not present

## 2019-06-11 DIAGNOSIS — R69 Illness, unspecified: Secondary | ICD-10-CM | POA: Diagnosis not present

## 2019-06-12 ENCOUNTER — Other Ambulatory Visit: Payer: Self-pay | Admitting: Family Medicine

## 2019-06-15 MED ORDER — AMLODIPINE BESY-BENAZEPRIL HCL 10-20 MG PO CAPS: ORAL_CAPSULE | ORAL | 0 refills | Status: DC

## 2019-06-17 ENCOUNTER — Telehealth: Payer: Self-pay | Admitting: Family Medicine
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
## 2019-06-17 NOTE — Telephone Encounter (Signed)
Left detailed message stating requested letter ready for pick up and to call back with any further questions.
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
## 2019-07-10 ENCOUNTER — Ambulatory Visit (INDEPENDENT_AMBULATORY_CARE_PROVIDER_SITE_OTHER): Payer: Medicare HMO | Admitting: Family Medicine

## 2019-07-10 ENCOUNTER — Other Ambulatory Visit: Payer: Self-pay

## 2019-07-10 ENCOUNTER — Encounter: Payer: Self-pay | Admitting: Family Medicine

## 2019-07-10 DIAGNOSIS — S93491A Sprain of other ligament of right ankle, initial encounter: Secondary | ICD-10-CM | POA: Diagnosis not present
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
## 2019-07-10 NOTE — Patient Instructions (Signed)
Ankle Sprain, Phase I Rehab An ankle sprain is an injury to the ligaments of your ankle. Ankle sprains cause stiffness, loss of motion, and loss of strength. Ask your health care provider which exercises are safe for you. Do exercises exactly as told by your health care provider and adjust them as directed. It is normal to feel mild stretching, pulling, tightness, or discomfort as you do these exercises. Stop right away if you feel sudden pain or your pain gets worse. Do not begin these exercises until told by your health care provider. Stretching and range-of-motion exercises These exercises warm up your muscles and joints and improve the movement and flexibility of your lower leg and ankle. These exercises also help to relieve pain and stiffness. Gastroc and soleus stretch This exercise is also called a calf stretch. It stretches the muscles in the back of the lower leg. These muscles are the gastrocnemius, or gastroc, and the soleus. 1. Sit on the floor with your left / right leg extended. 2. Loop a belt or towel around the ball of your left / right foot. The ball of your foot is on the walking surface, right under your toes. 3. Keep your left / right ankle and foot relaxed and keep your knee straight while you use the belt or towel to pull your foot toward you. You should feel a gentle stretch behind your calf or knee in your gastroc muscle. 4. Hold this position for __________ seconds, then release to the starting position. 5. Repeat the exercise with your knee bent. You can put a pillow or a rolled bath towel under your knee to support it. You should feel a stretch deep in your calf in the soleus muscle or at your Achilles tendon. Repeat __________ times. Complete this exercise __________ times a day. Ankle alphabet  1. Sit with your left / right leg supported at the lower leg. ? Do not rest your foot on anything. ? Make sure your foot has room to move freely. 2. Think of your left / right  foot as a paintbrush. ? Move your foot to trace each letter of the alphabet in the air. Keep your hip and knee still while you trace. ? Make the letters as large as you can without feeling discomfort. 3. Trace every letter from A to Z. Repeat __________ times. Complete this exercise __________ times a day. Strengthening exercises These exercises build strength and endurance in your ankle and lower leg. Endurance is the ability to use your muscles for a long time, even after they get tired. Ankle dorsiflexion  1. Secure a rubber exercise band or tube to an object, such as a table leg, that will stay still when the band is pulled. Secure the other end around your left / right foot. 2. Sit on the floor facing the object, with your left / right leg extended. The band or tube should be slightly tense when your foot is relaxed. 3. Slowly bring your foot toward you, bringing the top of your foot toward your shin (dorsiflexion), and pulling the band tighter. 4. Hold this position for __________ seconds. 5. Slowly return your foot to the starting position. Repeat __________ times. Complete this exercise __________ times a day. Ankle plantar flexion  1. Sit on the floor with your left / right leg extended. 2. Loop a rubber exercise tube or band around the ball of your left / right foot. The ball of your foot is on the walking surface, right under your   toes. ? Hold the ends of the band or tube in your hands. ? The band or tube should be slightly tense when your foot is relaxed. 3. Slowly point your foot and toes downward to tilt the top of your foot away from your shin (plantar flexion). 4. Hold this position for __________ seconds. 5. Slowly return your foot to the starting position. Repeat __________ times. Complete this exercise __________ times a day. Ankle eversion 1. Sit on the floor with your legs straight out in front of you. 2. Loop a rubber exercise band or tube around the ball of your left  / right foot. The ball of your foot is on the walking surface, right under your toes. ? Hold the ends of the band in your hands, or secure the band to a stable object. ? The band or tube should be slightly tense when your foot is relaxed. 3. Slowly push your foot outward, away from your other leg (eversion). 4. Hold this position for __________ seconds. 5. Slowly return your foot to the starting position. Repeat __________ times. Complete this exercise __________ times a day. This information is not intended to replace advice given to you by your health care provider. Make sure you discuss any questions you have with your health care provider. Document Released: 07/18/2005 Document Revised: 04/07/2019 Document Reviewed: 09/29/2018 Elsevier Patient Education  [place].   Ankle Sprain, Phase II Rehab An ankle sprain is an injury to tissue that connects bone to bone (a ligament) in the ankle. Ankle sprains usually cause stiffness, loss of motion, and loss of strength. Ask your health care provider which exercises are safe for you. Do exercises exactly as told by your health care provider and adjust them as directed. It is normal to feel mild stretching, pulling, tightness, or discomfort as you do these exercises. Stop right away if you feel sudden pain or your pain gets worse. Do not begin these exercises until told by your health care provider. Stretching and range-of-motion exercises These exercises warm up your muscles and joints and improve the movement and flexibility of your lower leg and ankle. These exercises also help to relieve pain and stiffness. Standing gastroc stretch This exercise is also called a standing calf (gastroc) stretch. 1. Stand with your hands against a wall. 2. Extend your left / right leg behind you, and bend your front knee slightly. Your heels should be on the floor. 3. Keeping your heels on the floor and your back knee straight, shift your weight toward the  wall. You should feel a gentle stretch in the back of your lower leg (calf). 4. Hold this position for __________ seconds. Repeat __________ times. Complete this exercise __________ times a day. Standing soleus stretch This exercise is also called a standing calf (soleus) stretch. 1. Stand with your hands against a wall. 2. Extend your left / right leg behind you, and bend your front knee slightly. Both of your heels should be on the floor. 3. Keeping your heels on the floor, bend your back knee and shift your weight slightly over your back leg. You should feel a gentle stretch deep in your calf. 4. Hold this position for __________ seconds. Repeat __________ times. Complete this exercise __________ times a day. Strengthening exercises These exercises build strength and endurance in your lower leg. Endurance is the ability to use your muscles for a long time, even after they get tired. Heel walking  This exercise is sometimes called dorsiflexion. 1. Walk on  your heels for __________ seconds or ___________ ft. Keep your toes as high as possible. Repeat __________ times. Complete this exercise __________ times a day. Balance exercises These exercises improve your balance and the reaction and control of your ankle to help improve stability. Multi-angle lunge 1. Stand with your feet together. 2. Take a step forward with your left / right leg, and shift your weight onto that leg. Your back heel will come off the floor, and your back toes will stay in place. 3. Push off your front leg to return your front foot to the starting position next to your other foot. 4. Repeat to the side, to the back, and any other directions as told by your health care provider. Repeat __________ times. Complete this exercise __________ times a day. Single leg stand If this exercise is too easy, you can try it with your eyes closed or while standing on a pillow. 1. Without shoes, stand near a railing or in a door  frame. Hold on to the railing or door frame as needed. Let loose of the railing or door frame as you are able. 2. Stand on your left / right foot. Keep your big toe down on the floor and try to keep your arch lifted. 3. Hold this position for __________ seconds. Repeat __________ times. Complete this exercise __________ times a day. Ankle inversion and eversion This exercise is also called foot rotation with a balance board. This exercise uses a balance board to rotate the foot and ankle inward (inversion) and outward (eversion). Ask your health care provider where you can get a balance board or how you can make one. 1. Stand on a non-carpeted surface near a countertop or wall. 2. Step onto the balance board so your feet are hip width apart. 3. Keep your feet in place and keep your upper body and hips steady. 4. Using only your feet and ankles to move the board, do the following exercises as told by your health care provider: ? Tip the board side to side as far as you can, alternating between tipping to the left and tipping to the right. ? Tip the board so it silently taps the floor. Do not let the board forcefully hit the floor. ? From time to time, pause to hold a steady midway position, with neither the right nor the left sides touching the ground. ? Tip the board side to side so the board does not hit the floor at all. From time to time, pause to hold a steady midway position. Repeat __________ times. Complete this exercise __________ times a day. Ankle plantar flexion and dorsiflexion This exercise is also called foot flexion with a balance board. This exercise uses a balance board to push the foot downward and away from the leg (plantar flexion) or upward and toward the leg (dorsiflexion). Ask your health care provider where you can get a balance board or how you can make one. 1. Stand on a non-carpeted surface near a countertop or wall. 2. Step onto the balance board so your feet are hip  width apart. 3. Keep your feet in place and keep your upper body and hips steady. 4. Using only your feet and ankles to move the board, do one or both of the following exercises as told by your health care provider: ? Tip the board forward and backward so the board silently taps the floor. Do not let the board forcefully hit the floor. ? From time to time, pause to  hold a steady position midway between touching the floor in front and touching the floor in back. ? Tip the board forward and backward so the board does not hit the floor at all. From time to time, pause to hold a steady position in the middle. Repeat __________ times. Complete this exercise __________ times a day. This information is not intended to replace advice given to you by your health care provider. Make sure you discuss any questions you have with your health care provider. Document Released: 04/08/2006 Document Revised: 04/07/2019 Document Reviewed: 09/29/2018 Elsevier Patient Education  2020 [employer].

## 2019-09-28 ENCOUNTER — Telehealth: Payer: Self-pay | Admitting: Family Medicine

## 2019-09-29 ENCOUNTER — Telehealth: Payer: Self-pay | Admitting: Family Medicine

## 2019-09-29 ENCOUNTER — Other Ambulatory Visit: Payer: Self-pay | Admitting: *Deleted

## 2019-09-29 MED ORDER — AMLODIPINE BESY-BENAZEPRIL HCL 10-20 MG PO CAPS: ORAL_CAPSULE | ORAL | 0 refills | Status: DC

## 2019-09-29 NOTE — Telephone Encounter (Signed)
Patient aware, script is ready. 

## 2019-10-23 ENCOUNTER — Telehealth: Payer: Medicare HMO | Admitting: Family Medicine

## 2019-10-23 ENCOUNTER — Encounter: Payer: Self-pay | Admitting: Family Medicine

## 2019-10-23 ENCOUNTER — Telehealth (INDEPENDENT_AMBULATORY_CARE_PROVIDER_SITE_OTHER): Payer: Medicare HMO | Admitting: Family Medicine

## 2019-10-23 DIAGNOSIS — M5431 Sciatica, right side: Secondary | ICD-10-CM | POA: Diagnosis not present

## 2019-10-23 DIAGNOSIS — S93491D Sprain of other ligament of right ankle, subsequent encounter: Secondary | ICD-10-CM

## 2019-10-23 MED ORDER — PREDNISONE 20 MG PO TABS: ORAL_TABLET | ORAL | 0 refills | Status: DC
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
## 2019-12-01 ENCOUNTER — Other Ambulatory Visit: Payer: Self-pay

## 2019-12-03 ENCOUNTER — Encounter: Payer: Self-pay | Admitting: Family Medicine

## 2019-12-03 ENCOUNTER — Ambulatory Visit (INDEPENDENT_AMBULATORY_CARE_PROVIDER_SITE_OTHER): Payer: Medicare HMO | Admitting: Family Medicine

## 2019-12-03 DIAGNOSIS — S76301A Unspecified injury of muscle, fascia and tendon of the posterior muscle group at thigh level, right thigh, initial encounter: Secondary | ICD-10-CM | POA: Diagnosis not present

## 2019-12-03 NOTE — Progress Notes (Signed)
Virtual Visit via telephone Note  I connected with William Lutz on 12/03/19 at 1028 by telephone and verified that I am speaking with the correct person using two identifiers. William Lutz is currently located at home and no other people are currently with her during visit. The provider, Fransisca Kaufmann Green [place], MD is located in their office at time of visit.  Call ended at 1042  I discussed the limitations, risks, security and privacy concerns of performing an evaluation and management service by telephone and the availability of in person appointments. I also discussed with the patient that there may be a patient responsible charge related to this service. The patient expressed understanding and agreed to proceed.   History and Present Illness: 1 week ago patient stepped wrong and tripped and felt a pull in the back of his right hamstring.  He fell to the ground on his hands.  Since then he has been having pain in the right hamstring.  He has seen the massage therapist and did not feel like anything broken.  He has taken heat and ibuprofen and has also used tylenol for the past week. He started getting out a little on the 5th day.  He went to the chiropractor and it helped some.   The chiropractor said he said his right leg is still swollen.  He has noticed the medication is down 50% and pain is down 50% as well over the past 2 days. Driving does affect it more as well because of the gas pedal and pushing the brake.   No diagnosis found.  Outpatient Encounter Medications as of 12/03/2019  Medication Sig  . albuterol (PROVENTIL HFA;VENTOLIN HFA) 108 (90 Base) MCG/ACT inhaler Inhale 2 puffs into the lungs every 6 (six) hours as needed for wheezing or shortness of breath.  Marland Kitchen amLODipine-benazepril (LOTREL) 10-20 MG capsule TAKE (1) CAPSULE BY MOUTH ONCE DAILY.  Marland Kitchen aspirin 81 MG tablet Take 81 mg by mouth daily.  . magnesium 30 MG tablet Take 30 mg by mouth 2 (two) times daily.  . meloxicam (MOBIC) 7.5  MG tablet Take 2 tablets (15 mg total) by mouth daily.  . Multiple Vitamin (MULTIVITAMIN WITH MINERALS) TABS tablet Take 1 tablet by mouth daily.  . niacin 250 MG tablet Take 250 mg by mouth at bedtime.  . Omega-3 Fatty Acids (FISH OIL) 1000 MG CAPS Take 5 capsules by mouth daily.   Marland Kitchen OVER THE COUNTER MEDICATION Vit A and Vit D OTC , Herbal Supplement  . predniSONE (DELTASONE) 20 MG tablet 2 po at same time daily for 5 days   No facility-administered encounter medications on file as of 12/03/2019.     Review of Systems  Constitutional: Negative for chills and fever.  Respiratory: Negative for shortness of breath and wheezing.   Cardiovascular: Negative for chest pain and leg swelling.  Musculoskeletal: Positive for arthralgias and myalgias. Negative for back pain and gait problem.  Skin: Negative for rash.  All other systems reviewed and are negative.   Observations/Objective: Patient sounds comfortable and in no acute distress  Assessment and Plan: Problem List Items Addressed This Visit    None    Visit Diagnoses    Right hamstring injury, initial encounter    -  Primary       Follow Up Instructions: Follow up as needed, if worsens we will do PT in the future.   Sounds to be improving  I discussed the assessment and treatment plan with the patient. The patient  was provided an opportunity to ask questions and all were answered. The patient agreed with the plan and demonstrated an understanding of the instructions.   The patient was advised to call back or seek an in-person evaluation if the symptoms worsen or if the condition fails to improve as anticipated.  The above assessment and management plan was discussed with the patient. The patient verbalized understanding of and has agreed to the management plan. Patient is aware to call the clinic if symptoms persist or worsen. Patient is aware when to return to the clinic for a follow-up visit. Patient educated on when it is  appropriate to go to the emergency department.    I provided 14 minutes of non-face-to-face time during this encounter.    Worthy Rancher, MD

## 2019-12-15 DIAGNOSIS — R69 Illness, unspecified: Secondary | ICD-10-CM | POA: Diagnosis not present

## 2020-01-02 ENCOUNTER — Other Ambulatory Visit: Payer: Self-pay | Admitting: Family Medicine

## 2020-01-04 ENCOUNTER — Other Ambulatory Visit: Payer: Self-pay | Admitting: Family Medicine

## 2020-01-04 NOTE — Telephone Encounter (Signed)
LMOVM refills sent to pharmacy today, to call office to make 6 mos appt

## 2020-01-05 ENCOUNTER — Telehealth: Payer: Self-pay | Admitting: Family Medicine

## 2020-01-06 NOTE — Telephone Encounter (Signed)
Patient wanting to know if he can come in for labs before his apt- advised he could.

## 2020-01-06 NOTE — Telephone Encounter (Signed)
lmtcb

## 2020-02-04 DIAGNOSIS — R69 Illness, unspecified: Secondary | ICD-10-CM | POA: Diagnosis not present

## 2020-02-15 DIAGNOSIS — R69 Illness, unspecified: Secondary | ICD-10-CM | POA: Diagnosis not present

## 2020-02-17 ENCOUNTER — Other Ambulatory Visit: Payer: Medicare HMO

## 2020-02-17 ENCOUNTER — Other Ambulatory Visit: Payer: Self-pay | Admitting: Family Medicine

## 2020-02-17 ENCOUNTER — Other Ambulatory Visit: Payer: Self-pay

## 2020-02-17 DIAGNOSIS — I10 Essential (primary) hypertension: Secondary | ICD-10-CM

## 2020-02-17 DIAGNOSIS — R7303 Prediabetes: Secondary | ICD-10-CM | POA: Diagnosis not present

## 2020-02-17 LAB — BAYER DCA HB A1C WAIVED: HB A1C (BAYER DCA - WAIVED): 5.5 % (ref <7.0)

## 2020-02-17 NOTE — Progress Notes (Signed)
Placed orders for lab visit prior to him coming in for his normal visit.

## 2020-02-18 LAB — SPECIMEN STATUS REPORT

## 2020-02-19 LAB — CBC WITH DIFFERENTIAL/PLATELET
Basophils Absolute: 0 10*3/uL (ref 0.0–0.2)
Basos: 1 %
EOS (ABSOLUTE): 0.3 10*3/uL (ref 0.0–0.4)
Eos: 3 %
Hematocrit: 43.5 % (ref 37.5–51.0)
Hemoglobin: 14.7 g/dL (ref 13.0–17.7)
Immature Grans (Abs): 0 10*3/uL (ref 0.0–0.1)
Immature Granulocytes: 0 %
Lymphocytes Absolute: 1.9 10*3/uL (ref 0.7–3.1)
Lymphs: 24 %
MCH: 28.5 pg (ref 26.6–33.0)
MCHC: 33.8 g/dL (ref 31.5–35.7)
MCV: 85 fL (ref 79–97)
Monocytes Absolute: 0.9 10*3/uL (ref 0.1–0.9)
Monocytes: 12 %
Neutrophils Absolute: 4.7 10*3/uL (ref 1.4–7.0)
Neutrophils: 60 %
Platelets: 228 10*3/uL (ref 150–450)
RBC: 5.15 x10E6/uL (ref 4.14–5.80)
RDW: 12.8 % (ref 11.6–15.4)
WBC: 7.8 10*3/uL (ref 3.4–10.8)

## 2020-02-19 LAB — LIPID PANEL
Chol/HDL Ratio: 4.8 ratio (ref 0.0–5.0)
Cholesterol, Total: 164 mg/dL (ref 100–199)
HDL: 34 mg/dL — ABNORMAL LOW (ref >39)
LDL Chol Calc (NIH): 109 mg/dL — ABNORMAL HIGH (ref 0–99)
Triglycerides: 117 mg/dL (ref 0–149)
VLDL Cholesterol Cal: 21 mg/dL (ref 5–40)

## 2020-02-19 LAB — CMP14+EGFR
ALT: 15 IU/L (ref 0–44)
AST: 16 IU/L (ref 0–40)
Albumin/Globulin Ratio: 1.6 (ref 1.2–2.2)
Albumin: 4.3 g/dL (ref 3.8–4.8)
Alkaline Phosphatase: 20 IU/L — ABNORMAL LOW (ref 39–117)
BUN/Creatinine Ratio: 18 (ref 10–24)
BUN: 17 mg/dL (ref 8–27)
Bilirubin Total: 0.2 mg/dL (ref 0.0–1.2)
CO2: 25 mmol/L (ref 20–29)
Calcium: 9.7 mg/dL (ref 8.6–10.2)
Chloride: 103 mmol/L (ref 96–106)
Creatinine, Ser: 0.94 mg/dL (ref 0.76–1.27)
GFR calc Af Amer: 97 mL/min/{1.73_m2} (ref >59)
GFR calc non Af Amer: 84 mL/min/{1.73_m2} (ref >59)
Globulin, Total: 2.7 g/dL (ref 1.5–4.5)
Glucose: 102 mg/dL — ABNORMAL HIGH (ref 65–99)
Potassium: 4.6 mmol/L (ref 3.5–5.2)
Sodium: 141 mmol/L (ref 134–144)
Total Protein: 7 g/dL (ref 6.0–8.5)

## 2020-02-23 ENCOUNTER — Other Ambulatory Visit: Payer: Self-pay

## 2020-02-24 ENCOUNTER — Encounter: Payer: Self-pay | Admitting: Family Medicine

## 2020-02-24 ENCOUNTER — Ambulatory Visit (INDEPENDENT_AMBULATORY_CARE_PROVIDER_SITE_OTHER): Payer: Medicare HMO | Admitting: Family Medicine

## 2020-02-24 VITALS — BP 127/75 | HR 71 | Temp 97.0°F | Ht 73.0 in | Wt 205.0 lb

## 2020-02-24 DIAGNOSIS — R7303 Prediabetes: Secondary | ICD-10-CM

## 2020-02-24 DIAGNOSIS — E663 Overweight: Secondary | ICD-10-CM

## 2020-02-24 DIAGNOSIS — I10 Essential (primary) hypertension: Secondary | ICD-10-CM | POA: Diagnosis not present

## 2020-02-24 MED ORDER — AMLODIPINE BESY-BENAZEPRIL HCL 10-20 MG PO CAPS: ORAL_CAPSULE | ORAL | 3 refills | Status: DC

## 2020-02-24 NOTE — Progress Notes (Signed)
BP 127/75   Pulse 71   Temp (!) 97 F (36.1 C) (Temporal)   Ht '6\' 1"'$  (1.854 m)   Wt 205 lb (93 kg)   SpO2 97%   BMI 27.05 kg/m    Subjective:   Patient ID: William Lutz, male    DOB: [birth year], 70 y.o.   MRN: [id number]  HPI: William Lutz is a 70 y.o. male presenting on 02/24/2020 for Hypertension (6 month follow up)   HPI Prediabetes Patient comes in today for recheck of his diabetes. Patient has been currently taking no medication his blood sugar is 102, has been cut on the border.. Patient is currently on an ACE inhibitor/ARB. Patient has not seen an ophthalmologist this year. Patient denies any issues with their feet.   Hypertension Patient is currently on amlodipine-benazepril, and their blood pressure today is 127/75. Patient denies any lightheadedness or dizziness. Patient denies headaches, blurred vision, chest pains, shortness of breath, or weakness. Denies any side effects from medication and is content with current medication.   Patient is overweight and we discussed healthy lifestyle changes and he is already making some since the call he got about his lab results.  Relevant past medical, surgical, family and social history reviewed and updated as indicated. Interim medical history since our last visit reviewed. Allergies and medications reviewed and updated.  Review of Systems  Constitutional: Negative for chills and fever.  Respiratory: Negative for shortness of breath and wheezing.   Cardiovascular: Negative for chest pain and leg swelling.  Musculoskeletal: Negative for back pain and gait problem.  Skin: Negative for rash.  Neurological: Negative for dizziness, weakness and light-headedness.  All other systems reviewed and are negative.   Per HPI unless specifically indicated above   Allergies as of 02/24/2020   No Known Allergies     Medication List       Accurate as of February 24, 2020 10:08 AM. If you have any questions, ask your nurse or doctor.         STOP taking these medications   meloxicam 7.5 MG tablet Commonly known as: Mobic Stopped by: Fransisca Kaufmann Allen Egerton, MD     TAKE these medications   albuterol 108 (90 Base) MCG/ACT inhaler Commonly known as: VENTOLIN HFA Inhale 2 puffs into the lungs every 6 (six) hours as needed for wheezing or shortness of breath.   amLODipine-benazepril 10-20 MG capsule Commonly known as: LOTREL Take 1 capsule by mouth once daily   aspirin 81 MG tablet Take 81 mg by mouth daily.   Fish Oil 1000 MG Caps Take 5 capsules by mouth daily.   magnesium 30 MG tablet Take 30 mg by mouth 2 (two) times daily.   multivitamin with minerals Tabs tablet Take 1 tablet by mouth daily.   niacin 250 MG tablet Take 250 mg by mouth at bedtime.   OVER THE COUNTER MEDICATION Vit A and Vit D OTC , Herbal Supplement        Objective:   BP 127/75   Pulse 71   Temp (!) 97 F (36.1 C) (Temporal)   Ht '6\' 1"'$  (1.854 m)   Wt 205 lb (93 kg)   SpO2 97%   BMI 27.05 kg/m   Wt Readings from Last 3 Encounters:  02/24/20 205 lb (93 kg)  04/15/19 190 lb (86.2 kg)  05/27/18 191 lb (86.6 kg)    Physical Exam Vitals and nursing note reviewed.  Constitutional:      General: He is not in  acute distress.    Appearance: He is well-developed. He is not diaphoretic.  Eyes:     General: No scleral icterus.    Conjunctiva/sclera: Conjunctivae normal.  Neck:     Thyroid: No thyromegaly.  Cardiovascular:     Rate and Rhythm: Normal rate and regular rhythm.     Heart sounds: Normal heart sounds. No murmur.  Pulmonary:     Effort: Pulmonary effort is normal. No respiratory distress.     Breath sounds: Normal breath sounds. No wheezing.  Musculoskeletal:        General: No swelling.     Cervical back: Neck supple.  Lymphadenopathy:     Cervical: No cervical adenopathy.  Skin:    General: Skin is warm and dry.     Findings: No rash.  Neurological:     Mental Status: He is alert and oriented to person,  place, and time.     Coordination: Coordination normal.  Psychiatric:        Behavior: Behavior normal.     Results for orders placed or performed in visit on 02/17/20  CBC with Differential/Platelet  Result Value Ref Range   WBC 7.8 3.4 - 10.8 x10E3/uL   RBC 5.15 4.14 - 5.80 x10E6/uL   Hemoglobin 14.7 13.0 - 17.7 g/dL   Hematocrit 43.5 37.5 - 51.0 %   MCV 85 79 - 97 fL   MCH 28.5 26.6 - 33.0 pg   MCHC 33.8 31.5 - 35.7 g/dL   RDW 12.8 11.6 - 15.4 %   Platelets 228 150 - 450 x10E3/uL   Neutrophils 60 Not Estab. %   Lymphs 24 Not Estab. %   Monocytes 12 Not Estab. %   Eos 3 Not Estab. %   Basos 1 Not Estab. %   Neutrophils Absolute 4.7 1.4 - 7.0 x10E3/uL   Lymphocytes Absolute 1.9 0.7 - 3.1 x10E3/uL   Monocytes Absolute 0.9 0.1 - 0.9 x10E3/uL   EOS (ABSOLUTE) 0.3 0.0 - 0.4 x10E3/uL   Basophils Absolute 0.0 0.0 - 0.2 x10E3/uL   Immature Granulocytes 0 Not Estab. %   Immature Grans (Abs) 0.0 0.0 - 0.1 x10E3/uL  CMP14+EGFR  Result Value Ref Range   Glucose 102 (H) 65 - 99 mg/dL   BUN 17 8 - 27 mg/dL   Creatinine, Ser 0.94 0.76 - 1.27 mg/dL   GFR calc non Af Amer 84 >59 mL/min/1.73   GFR calc Af Amer 97 >59 mL/min/1.73   BUN/Creatinine Ratio 18 10 - 24   Sodium 141 134 - 144 mmol/L   Potassium 4.6 3.5 - 5.2 mmol/L   Chloride 103 96 - 106 mmol/L   CO2 25 20 - 29 mmol/L   Calcium 9.7 8.6 - 10.2 mg/dL   Total Protein 7.0 6.0 - 8.5 g/dL   Albumin 4.3 3.8 - 4.8 g/dL   Globulin, Total 2.7 1.5 - 4.5 g/dL   Albumin/Globulin Ratio 1.6 1.2 - 2.2   Bilirubin Total 0.2 0.0 - 1.2 mg/dL   Alkaline Phosphatase 20 (L) 39 - 117 IU/L   AST 16 0 - 40 IU/L   ALT 15 0 - 44 IU/L  Lipid panel  Result Value Ref Range   Cholesterol, Total 164 100 - 199 mg/dL   Triglycerides 117 0 - 149 mg/dL   HDL 34 (L) >39 mg/dL   VLDL Cholesterol Cal 21 5 - 40 mg/dL   LDL Chol Calc (NIH) 109 (H) 0 - 99 mg/dL   Chol/HDL Ratio 4.8 0.0 - 5.0 ratio  Specimen status report  Result Value Ref Range    specimen status report Comment     Assessment & Plan:   Problem List Items Addressed This Visit      Cardiovascular and Mediastinum   Essential hypertension - Primary   Relevant Medications   amLODipine-benazepril (LOTREL) 10-20 MG capsule   Other Relevant Orders   CMP14+EGFR     Other   Prediabetes   Relevant Orders   Bayer DCA Hb A1c Waived   Overweight (BMI 25.0-29.9)      Blood work looks good, continue current medication, have come back in 6 months Follow up plan: Return in about 6 months (around 08/23/2020), or if symptoms worsen or fail to improve, for Hypertension and prediabetes.  Counseling provided for all of the vaccine components No orders of the defined types were placed in this encounter.   Caryl Pina, MD [facility] 02/24/2020, 10:08 AM

## 2020-03-11 ENCOUNTER — Ambulatory Visit (INDEPENDENT_AMBULATORY_CARE_PROVIDER_SITE_OTHER): Payer: Medicare HMO | Admitting: Family Medicine

## 2020-03-11 ENCOUNTER — Encounter: Payer: Self-pay | Admitting: Family Medicine

## 2020-03-11 DIAGNOSIS — M19039 Primary osteoarthritis, unspecified wrist: Secondary | ICD-10-CM

## 2020-03-11 MED ORDER — PREDNISONE 20 MG PO TABS: ORAL_TABLET | ORAL | 0 refills | Status: DC

## 2020-03-11 NOTE — Progress Notes (Signed)
Virtual Visit via telephone Note  I connected with William Lutz on 03/11/20 at [phone] by telephone and verified that I am speaking with the correct person using two identifiers. [name] is currently located at home and no other people are currently with her during visit. The provider, Fransisca Kaufmann Stalin Gruenberg, MD is located in their office at time of visit.  Call ended at [phone]  I discussed the limitations, risks, security and privacy concerns of performing an evaluation and management service by telephone and the availability of in person appointments. I also discussed with the patient that there may be a patient responsible charge related to this service. The patient expressed understanding and agreed to proceed.   History and Present Illness: Patient is calling in for issues with sleeping issues that has been gradually increasing over the past 6 weeks.  He sleeps for 1-3 hours and he wakes up with sore hands wrists and forearms of pain. He has taken tylenol and ibuprofen to help him get back to sleep.  He will use Voltaren cream and he has worsened over the past 2-3 weeks. The pain is the worse in the hands and feels like fingers are swollen.  He drives a dump truck daily.   Outpatient Encounter Medications as of 03/11/2020  Medication Sig  . albuterol (PROVENTIL HFA;VENTOLIN HFA) 108 (90 Base) MCG/ACT inhaler Inhale 2 puffs into the lungs every 6 (six) hours as needed for wheezing or shortness of breath.  Marland Kitchen amLODipine-benazepril (LOTREL) 10-20 MG capsule Take 1 capsule by mouth once daily  . aspirin 81 MG tablet Take 81 mg by mouth daily.  . magnesium 30 MG tablet Take 30 mg by mouth 2 (two) times daily.  . Multiple Vitamin (MULTIVITAMIN WITH MINERALS) TABS tablet Take 1 tablet by mouth daily.  . niacin 250 MG tablet Take 250 mg by mouth at bedtime.  . Omega-3 Fatty Acids (FISH OIL) 1000 MG CAPS Take 5 capsules by mouth daily.   Marland Kitchen OVER THE COUNTER MEDICATION Vit A and Vit D OTC , Herbal  Supplement   No facility-administered encounter medications on file as of 03/11/2020.    Review of Systems  Constitutional: Negative for chills and fever.  Respiratory: Negative for shortness of breath and wheezing.   Cardiovascular: Negative for chest pain and leg swelling.  Musculoskeletal: Positive for arthralgias. Negative for back pain and gait problem.  Skin: Negative for rash.  All other systems reviewed and are negative.   Observations/Objective: Patient sounds comfortable and in no acute distress  Assessment and Plan: Problem List Items Addressed This Visit    None    Visit Diagnoses    Wrist arthritis    -  Primary      He had carpal tunnel surgery 4 years ago, Dr Fredna Dow Follow up plan: Return if symptoms worsen or fail to improve.     I discussed the assessment and treatment plan with the patient. The patient was provided an opportunity to ask questions and all were answered. The patient agreed with the plan and demonstrated an understanding of the instructions.   The patient was advised to call back or seek an in-person evaluation if the symptoms worsen or if the condition fails to improve as anticipated.  The above assessment and management plan was discussed with the patient. The patient verbalized understanding of and has agreed to the management plan. Patient is aware to call the clinic if symptoms persist or worsen. Patient is aware when to return to the  clinic for a follow-up visit. Patient educated on when it is appropriate to go to the emergency department.    I provided 14 minutes of non-face-to-face time during this encounter.    Worthy Rancher, MD

## 2020-03-30 DIAGNOSIS — M7711 Lateral epicondylitis, right elbow: Secondary | ICD-10-CM | POA: Diagnosis not present

## 2020-03-30 DIAGNOSIS — M7712 Lateral epicondylitis, left elbow: Secondary | ICD-10-CM | POA: Diagnosis not present

## 2020-03-30 DIAGNOSIS — M47812 Spondylosis without myelopathy or radiculopathy, cervical region: Secondary | ICD-10-CM | POA: Diagnosis not present

## 2020-03-30 DIAGNOSIS — M79642 Pain in left hand: Secondary | ICD-10-CM | POA: Diagnosis not present

## 2020-03-30 DIAGNOSIS — M180 Bilateral primary osteoarthritis of first carpometacarpal joints: Secondary | ICD-10-CM | POA: Diagnosis not present

## 2020-03-30 DIAGNOSIS — M79641 Pain in right hand: Secondary | ICD-10-CM | POA: Diagnosis not present

## 2020-03-30 DIAGNOSIS — M19032 Primary osteoarthritis, left wrist: Secondary | ICD-10-CM | POA: Diagnosis not present

## 2020-03-30 DIAGNOSIS — M19031 Primary osteoarthritis, right wrist: Secondary | ICD-10-CM | POA: Diagnosis not present

## 2020-03-30 DIAGNOSIS — M542 Cervicalgia: Secondary | ICD-10-CM | POA: Diagnosis not present

## 2020-04-04 DIAGNOSIS — M79603 Pain in arm, unspecified: Secondary | ICD-10-CM | POA: Diagnosis not present

## 2020-04-04 DIAGNOSIS — M7711 Lateral epicondylitis, right elbow: Secondary | ICD-10-CM | POA: Diagnosis not present

## 2020-04-04 DIAGNOSIS — M25642 Stiffness of left hand, not elsewhere classified: Secondary | ICD-10-CM | POA: Diagnosis not present

## 2020-04-04 DIAGNOSIS — M7712 Lateral epicondylitis, left elbow: Secondary | ICD-10-CM | POA: Diagnosis not present

## 2020-04-04 DIAGNOSIS — M25641 Stiffness of right hand, not elsewhere classified: Secondary | ICD-10-CM | POA: Diagnosis not present

## 2020-04-04 DIAGNOSIS — R29898 Other symptoms and signs involving the musculoskeletal system: Secondary | ICD-10-CM | POA: Diagnosis not present

## 2020-04-13 DIAGNOSIS — M25431 Effusion, right wrist: Secondary | ICD-10-CM | POA: Diagnosis not present

## 2020-04-13 DIAGNOSIS — M2550 Pain in unspecified joint: Secondary | ICD-10-CM | POA: Diagnosis not present

## 2020-04-13 DIAGNOSIS — M7989 Other specified soft tissue disorders: Secondary | ICD-10-CM | POA: Diagnosis not present

## 2020-04-13 DIAGNOSIS — M7712 Lateral epicondylitis, left elbow: Secondary | ICD-10-CM | POA: Diagnosis not present

## 2020-04-13 DIAGNOSIS — M19042 Primary osteoarthritis, left hand: Secondary | ICD-10-CM | POA: Diagnosis not present

## 2020-04-13 DIAGNOSIS — M25432 Effusion, left wrist: Secondary | ICD-10-CM | POA: Diagnosis not present

## 2020-04-13 DIAGNOSIS — M47812 Spondylosis without myelopathy or radiculopathy, cervical region: Secondary | ICD-10-CM | POA: Diagnosis not present

## 2020-04-13 DIAGNOSIS — M19041 Primary osteoarthritis, right hand: Secondary | ICD-10-CM | POA: Diagnosis not present

## 2020-04-13 DIAGNOSIS — M7711 Lateral epicondylitis, right elbow: Secondary | ICD-10-CM | POA: Diagnosis not present

## 2020-04-13 DIAGNOSIS — M25641 Stiffness of right hand, not elsewhere classified: Secondary | ICD-10-CM | POA: Diagnosis not present

## 2020-04-13 DIAGNOSIS — M25642 Stiffness of left hand, not elsewhere classified: Secondary | ICD-10-CM | POA: Diagnosis not present

## 2020-04-13 DIAGNOSIS — M79603 Pain in arm, unspecified: Secondary | ICD-10-CM | POA: Diagnosis not present

## 2020-04-13 DIAGNOSIS — R29898 Other symptoms and signs involving the musculoskeletal system: Secondary | ICD-10-CM | POA: Diagnosis not present

## 2020-04-21 ENCOUNTER — Encounter: Payer: Self-pay | Admitting: Family Medicine

## 2020-05-06 DIAGNOSIS — M2550 Pain in unspecified joint: Secondary | ICD-10-CM | POA: Diagnosis not present

## 2020-05-06 DIAGNOSIS — M25431 Effusion, right wrist: Secondary | ICD-10-CM | POA: Diagnosis not present

## 2020-05-06 DIAGNOSIS — M25432 Effusion, left wrist: Secondary | ICD-10-CM | POA: Diagnosis not present

## 2020-06-08 DIAGNOSIS — L409 Psoriasis, unspecified: Secondary | ICD-10-CM | POA: Diagnosis not present

## 2020-06-08 DIAGNOSIS — M25431 Effusion, right wrist: Secondary | ICD-10-CM | POA: Diagnosis not present

## 2020-06-08 DIAGNOSIS — M2550 Pain in unspecified joint: Secondary | ICD-10-CM | POA: Diagnosis not present

## 2020-06-08 DIAGNOSIS — M25432 Effusion, left wrist: Secondary | ICD-10-CM | POA: Diagnosis not present
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
## 2020-06-09 DIAGNOSIS — H25013 Cortical age-related cataract, bilateral: Secondary | ICD-10-CM | POA: Diagnosis not present

## 2020-06-09 DIAGNOSIS — H524 Presbyopia: Secondary | ICD-10-CM | POA: Diagnosis not present

## 2020-06-09 DIAGNOSIS — H52201 Unspecified astigmatism, right eye: Secondary | ICD-10-CM | POA: Diagnosis not present

## 2020-06-09 DIAGNOSIS — H2513 Age-related nuclear cataract, bilateral: Secondary | ICD-10-CM | POA: Diagnosis not present

## 2020-06-15 DIAGNOSIS — R69 Illness, unspecified: Secondary | ICD-10-CM | POA: Diagnosis not present

## 2020-06-21 DIAGNOSIS — R69 Illness, unspecified: Secondary | ICD-10-CM | POA: Diagnosis not present

## 2020-07-08 DIAGNOSIS — L409 Psoriasis, unspecified: Secondary | ICD-10-CM | POA: Diagnosis not present

## 2020-07-08 DIAGNOSIS — L4050 Arthropathic psoriasis, unspecified: Secondary | ICD-10-CM | POA: Diagnosis not present

## 2020-07-21 DIAGNOSIS — M79675 Pain in left toe(s): Secondary | ICD-10-CM | POA: Diagnosis not present

## 2020-07-21 DIAGNOSIS — L03032 Cellulitis of left toe: Secondary | ICD-10-CM | POA: Diagnosis not present

## 2020-08-26 ENCOUNTER — Ambulatory Visit: Payer: Medicare HMO | Admitting: Family Medicine

## 2020-09-12 ENCOUNTER — Telehealth: Payer: Self-pay | Admitting: Family Medicine

## 2020-09-13 NOTE — Telephone Encounter (Signed)
Closing erroneous encounter

## 2021-03-02 ENCOUNTER — Telehealth: Payer: Self-pay

## 2021-03-02 DIAGNOSIS — E663 Overweight: Secondary | ICD-10-CM

## 2021-03-02 DIAGNOSIS — R7303 Prediabetes: Secondary | ICD-10-CM

## 2021-03-02 DIAGNOSIS — I10 Essential (primary) hypertension: Secondary | ICD-10-CM

## 2021-03-08 NOTE — Telephone Encounter (Signed)
Patient notified

## 2021-03-08 NOTE — Telephone Encounter (Signed)
Placed blood work ordered for the patient

## 2021-03-17 ENCOUNTER — Other Ambulatory Visit: Payer: Self-pay

## 2021-03-17 ENCOUNTER — Other Ambulatory Visit: Payer: Medicare HMO

## 2021-03-17 DIAGNOSIS — I10 Essential (primary) hypertension: Secondary | ICD-10-CM | POA: Diagnosis not present

## 2021-03-17 DIAGNOSIS — E663 Overweight: Secondary | ICD-10-CM

## 2021-03-17 DIAGNOSIS — R739 Hyperglycemia, unspecified: Secondary | ICD-10-CM | POA: Diagnosis not present

## 2021-03-17 DIAGNOSIS — R7303 Prediabetes: Secondary | ICD-10-CM | POA: Diagnosis not present

## 2021-03-17 LAB — LIPID PANEL

## 2021-03-17 LAB — CBC WITH DIFFERENTIAL/PLATELET
Immature Granulocytes: 0 %
Neutrophils: 52 %
RBC: 4.96 x10E6/uL (ref 4.14–5.80)

## 2021-03-17 LAB — CMP14+EGFR
ALT: 21 IU/L (ref 0–44)
Alkaline Phosphatase: 20 IU/L — ABNORMAL LOW (ref 44–121)

## 2021-03-17 LAB — BAYER DCA HB A1C WAIVED: HB A1C (BAYER DCA - WAIVED): 4.9 % (ref <7.0)

## 2021-03-18 LAB — LIPID PANEL
Chol/HDL Ratio: 4.9 ratio (ref 0.0–5.0)
HDL: 34 mg/dL — ABNORMAL LOW (ref >39)
LDL Chol Calc (NIH): 111 mg/dL — ABNORMAL HIGH (ref 0–99)
Triglycerides: 107 mg/dL (ref 0–149)
VLDL Cholesterol Cal: 20 mg/dL (ref 5–40)

## 2021-03-18 LAB — CBC WITH DIFFERENTIAL/PLATELET
Basophils Absolute: 0 10*3/uL (ref 0.0–0.2)
Basos: 1 %
EOS (ABSOLUTE): 0.2 10*3/uL (ref 0.0–0.4)
Eos: 4 %
Hematocrit: 42.9 % (ref 37.5–51.0)
Hemoglobin: 14.5 g/dL (ref 13.0–17.7)
Immature Grans (Abs): 0 10*3/uL (ref 0.0–0.1)
Lymphocytes Absolute: 1.7 10*3/uL (ref 0.7–3.1)
Lymphs: 30 %
MCH: 29.2 pg (ref 26.6–33.0)
MCHC: 33.8 g/dL (ref 31.5–35.7)
MCV: 87 fL (ref 79–97)
Monocytes Absolute: 0.7 10*3/uL (ref 0.1–0.9)
Monocytes: 13 %
Neutrophils Absolute: 2.8 10*3/uL (ref 1.4–7.0)
Platelets: 177 10*3/uL (ref 150–450)
RDW: 13 % (ref 11.6–15.4)
WBC: 5.5 10*3/uL (ref 3.4–10.8)

## 2021-03-18 LAB — CMP14+EGFR
AST: 23 IU/L (ref 0–40)
Albumin/Globulin Ratio: 2 (ref 1.2–2.2)
Albumin: 4.7 g/dL (ref 3.8–4.8)
BUN/Creatinine Ratio: 24 (ref 10–24)
BUN: 25 mg/dL (ref 8–27)
Bilirubin Total: 0.5 mg/dL (ref 0.0–1.2)
CO2: 21 mmol/L (ref 20–29)
Calcium: 9.4 mg/dL (ref 8.6–10.2)
Chloride: 105 mmol/L (ref 96–106)
Creatinine, Ser: 1.04 mg/dL (ref 0.76–1.27)
Globulin, Total: 2.4 g/dL (ref 1.5–4.5)
Glucose: 91 mg/dL (ref 65–99)
Potassium: 4 mmol/L (ref 3.5–5.2)
Sodium: 143 mmol/L (ref 134–144)
Total Protein: 7.1 g/dL (ref 6.0–8.5)
eGFR: 78 mL/min/{1.73_m2} (ref >59)

## 2021-03-30 ENCOUNTER — Other Ambulatory Visit: Payer: Self-pay | Admitting: Family Medicine

## 2021-04-06 ENCOUNTER — Other Ambulatory Visit: Payer: Self-pay

## 2021-04-06 ENCOUNTER — Ambulatory Visit (INDEPENDENT_AMBULATORY_CARE_PROVIDER_SITE_OTHER): Payer: Medicare HMO | Admitting: Family Medicine

## 2021-04-06 ENCOUNTER — Encounter: Payer: Self-pay | Admitting: Family Medicine

## 2021-04-06 VITALS — BP 128/86 | HR 71 | Ht 73.0 in | Wt 200.0 lb

## 2021-04-06 DIAGNOSIS — E7800 Pure hypercholesterolemia, unspecified: Secondary | ICD-10-CM

## 2021-04-06 DIAGNOSIS — I10 Essential (primary) hypertension: Secondary | ICD-10-CM

## 2021-04-06 DIAGNOSIS — E663 Overweight: Secondary | ICD-10-CM

## 2021-04-06 DIAGNOSIS — L4050 Arthropathic psoriasis, unspecified: Secondary | ICD-10-CM

## 2021-04-06 DIAGNOSIS — R7303 Prediabetes: Secondary | ICD-10-CM | POA: Diagnosis not present

## 2021-04-06 MED ORDER — AMLODIPINE BESY-BENAZEPRIL HCL 10-20 MG PO CAPS: ORAL_CAPSULE | ORAL | 3 refills | Status: DC

## 2021-04-06 MED ORDER — PREDNISONE 5 MG PO TABS: 5.0000 mg | ORAL_TABLET | Freq: Every day | ORAL | 1 refills | Status: DC | PRN

## 2021-04-06 NOTE — Progress Notes (Signed)
BP 128/86   Pulse 71   Ht $R'6\' 1"'kA$  (1.854 m)   Wt 200 lb (90.7 kg)   SpO2 97%   BMI 26.39 kg/m    Subjective:   Patient ID: William Lutz, male    DOB: [birth year], 71 y.o.   MRN: [id number]  HPI: William Lutz is a 71 y.o. male presenting on 04/06/2021 for Medical Management of Chronic Issues and Hypertension   HPI Hypertension Patient is currently on amlodipine-benazepril, and their blood pressure today is 120/86. Patient denies any lightheadedness or dizziness. Patient denies headaches, blurred vision, chest pains, shortness of breath, or weakness. Denies any side effects from medication and is content with current medication.   Psoriatic arthritis Patient has psoriatic arthritis and sees rheumatology for it every now and then, they have given him hydroxychloroquine and prednisone to be used as needed when he gets a flare but he does not get them too often and rarely uses them.  Likes to keep them on hand  Prediabetes Patient comes in today for recheck of his diabetes. Patient has been currently taking no medication is diet controlled, A1c is 4.9. Patient is currently on an ACE inhibitor/ARB. Patient has not seen an ophthalmologist this year. Patient denies any issues with their feet. The symptom started onset as an adult hyperlipidemia and hypertension ARE RELATED TO DM   Hyperlipidemia Patient is coming in for recheck of his hyperlipidemia. The patient is currently taking fish oil. They deny any issues with myalgias or history of liver damage from it. They deny any focal numbness or weakness or chest pain.   Relevant past medical, surgical, family and social history reviewed and updated as indicated. Interim medical history since our last visit reviewed. Allergies and medications reviewed and updated.  Review of Systems  Constitutional: Negative for chills and fever.  HENT: Negative for ear pain and tinnitus.   Eyes: Negative for pain and discharge.  Respiratory: Negative for cough,  shortness of breath and wheezing.   Cardiovascular: Negative for chest pain, palpitations and leg swelling.  Gastrointestinal: Negative for abdominal pain, blood in stool, constipation and diarrhea.  Genitourinary: Negative for dysuria and hematuria.  Musculoskeletal: Negative for back pain, gait problem and myalgias.  Skin: Negative for rash.  Neurological: Negative for dizziness, weakness and headaches.  Psychiatric/Behavioral: Negative for suicidal ideas.  All other systems reviewed and are negative.   Per HPI unless specifically indicated above   Allergies as of 04/06/2021   No Known Allergies     Medication List       Accurate as of April 06, 2021 10:16 AM. If you have any questions, ask your nurse or doctor.        albuterol 108 (90 Base) MCG/ACT inhaler Commonly known as: VENTOLIN HFA Inhale 2 puffs into the lungs every 6 (six) hours as needed for wheezing or shortness of breath.   amLODipine-benazepril 10-20 MG capsule Commonly known as: LOTREL Take 1 capsule by mouth once daily  (NEEDS TO BE SEEN BEFORE NEXT REFILL)   aspirin 81 MG tablet Take 81 mg by mouth daily.   Fish Oil 1000 MG Caps Take 5 capsules by mouth daily.   magnesium 30 MG tablet Take 30 mg by mouth 2 (two) times daily.   multivitamin with minerals Tabs tablet Take 1 tablet by mouth daily.   niacin 250 MG tablet Take 250 mg by mouth at bedtime.   OVER THE COUNTER MEDICATION Vit A and Vit D OTC , Herbal Supplement  predniSONE 5 MG tablet Commonly known as: DELTASONE Take 1 tablet (5 mg total) by mouth daily as needed. What changed:   medication strength  how much to take  how to take this  when to take this  reasons to take this  additional instructions Changed by: Elige Radon Ailis Rigaud, MD        Objective:   BP 128/86   Pulse 71   Ht 6\' 1"  (1.854 m)   Wt 200 lb (90.7 kg)   SpO2 97%   BMI 26.39 kg/m   Wt Readings from Last 3 Encounters:  04/06/21 200 lb (90.7 kg)   02/24/20 205 lb (93 kg)  04/15/19 190 lb (86.2 kg)    Physical Exam Vitals and nursing note reviewed.  Constitutional:      General: He is not in acute distress.    Appearance: He is well-developed. He is not diaphoretic.  Eyes:     General: No scleral icterus.    Conjunctiva/sclera: Conjunctivae normal.  Neck:     Thyroid: No thyromegaly.  Cardiovascular:     Rate and Rhythm: Normal rate and regular rhythm.     Heart sounds: Normal heart sounds. No murmur heard.   Pulmonary:     Effort: Pulmonary effort is normal. No respiratory distress.     Breath sounds: Normal breath sounds. No wheezing.  Musculoskeletal:        General: Normal range of motion.     Cervical back: Neck supple.  Lymphadenopathy:     Cervical: No cervical adenopathy.  Skin:    General: Skin is warm and dry.     Findings: No rash.  Neurological:     Mental Status: He is alert and oriented to person, place, and time.     Coordination: Coordination normal.  Psychiatric:        Behavior: Behavior normal.     Results for orders placed or performed in visit on 03/17/21  Lipid panel  Result Value Ref Range   Cholesterol, Total 165 100 - 199 mg/dL   Triglycerides 03/19/21 0 - 149 mg/dL   HDL 34 (L) 259 mg/dL   VLDL Cholesterol Cal 20 5 - 40 mg/dL   LDL Chol Calc (NIH) >56 (H) 0 - 99 mg/dL   Chol/HDL Ratio 4.9 0.0 - 5.0 ratio  CMP14+EGFR  Result Value Ref Range   Glucose 91 65 - 99 mg/dL   BUN 25 8 - 27 mg/dL   Creatinine, Ser 387 0.76 - 1.27 mg/dL   eGFR 78 5.64 >33   BUN/Creatinine Ratio 24 10 - 24   Sodium 143 134 - 144 mmol/L   Potassium 4.0 3.5 - 5.2 mmol/L   Chloride 105 96 - 106 mmol/L   CO2 21 20 - 29 mmol/L   Calcium 9.4 8.6 - 10.2 mg/dL   Total Protein 7.1 6.0 - 8.5 g/dL   Albumin 4.7 3.8 - 4.8 g/dL   Globulin, Total 2.4 1.5 - 4.5 g/dL   Albumin/Globulin Ratio 2.0 1.2 - 2.2   Bilirubin Total 0.5 0.0 - 1.2 mg/dL   Alkaline Phosphatase 20 (L) 44 - 121 IU/L   AST 23 0 - 40 IU/L    ALT 21 0 - 44 IU/L  CBC with Differential/Platelet  Result Value Ref Range   WBC 5.5 3.4 - 10.8 x10E3/uL   RBC 4.96 4.14 - 5.80 x10E6/uL   Hemoglobin 14.5 13.0 - 17.7 g/dL   Hematocrit IR/JJO/8.41 66.0 - 51.0 %   MCV 87 79 - 97  fL   MCH 29.2 26.6 - 33.0 pg   MCHC 33.8 31.5 - 35.7 g/dL   RDW 13.0 11.6 - 15.4 %   Platelets 177 150 - 450 x10E3/uL   Neutrophils 52 Not Estab. %   Lymphs 30 Not Estab. %   Monocytes 13 Not Estab. %   Eos 4 Not Estab. %   Basos 1 Not Estab. %   Neutrophils Absolute 2.8 1.4 - 7.0 x10E3/uL   Lymphocytes Absolute 1.7 0.7 - 3.1 x10E3/uL   Monocytes Absolute 0.7 0.1 - 0.9 x10E3/uL   EOS (ABSOLUTE) 0.2 0.0 - 0.4 x10E3/uL   Basophils Absolute 0.0 0.0 - 0.2 x10E3/uL   Immature Granulocytes 0 Not Estab. %   Immature Grans (Abs) 0.0 0.0 - 0.1 x10E3/uL  Bayer DCA Hb A1c Waived  Result Value Ref Range   HB A1C (BAYER DCA - WAIVED) 4.9 <7.0 %    Assessment & Plan:   Problem List Items Addressed This Visit      Cardiovascular and Mediastinum   Essential hypertension - Primary   Relevant Medications   amLODipine-benazepril (LOTREL) 10-20 MG capsule     Other   Prediabetes   Overweight (BMI 25.0-29.9)    Other Visit Diagnoses    Psoriatic arthritis ([place])       Relevant Medications   predniSONE (DELTASONE) 5 MG tablet   Pure hypercholesterolemia       Relevant Medications   amLODipine-benazepril (LOTREL) 10-20 MG capsule      Patient uses prednisone as needed when he gets a little psoriatic arthritis flare, he does not take it consistently all the time.  A1c looks great and lab work looks great.  No medication changes, continue on the blood pressure medicine  Will focus on diet and exercise for cholesterol levels and may try red yeast rice extract Follow up plan: Return in about 6 months (around 10/06/2021), or if symptoms worsen or fail to improve, for Prediabetes and hypertension.  Counseling provided for all of the vaccine components No orders of the  defined types were placed in this encounter.   Caryl Pina, MD [facility] 04/06/2021, 10:16 AM

## 2021-08-14 DIAGNOSIS — H524 Presbyopia: Secondary | ICD-10-CM | POA: Diagnosis not present

## 2021-08-14 DIAGNOSIS — H52201 Unspecified astigmatism, right eye: Secondary | ICD-10-CM | POA: Diagnosis not present

## 2021-08-14 DIAGNOSIS — H5211 Myopia, right eye: Secondary | ICD-10-CM | POA: Diagnosis not present

## 2021-08-14 DIAGNOSIS — H25013 Cortical age-related cataract, bilateral: Secondary | ICD-10-CM | POA: Diagnosis not present

## 2021-08-14 DIAGNOSIS — H2513 Age-related nuclear cataract, bilateral: Secondary | ICD-10-CM | POA: Diagnosis not present

## 2021-08-14 DIAGNOSIS — H5202 Hypermetropia, left eye: Secondary | ICD-10-CM | POA: Diagnosis not present

## 2021-12-05 ENCOUNTER — Other Ambulatory Visit: Payer: Self-pay | Admitting: Family Medicine

## 2021-12-05 ENCOUNTER — Telehealth: Payer: Self-pay | Admitting: Family Medicine

## 2021-12-05 DIAGNOSIS — L4050 Arthropathic psoriasis, unspecified: Secondary | ICD-10-CM

## 2021-12-05 DIAGNOSIS — E7800 Pure hypercholesterolemia, unspecified: Secondary | ICD-10-CM

## 2021-12-05 DIAGNOSIS — R7303 Prediabetes: Secondary | ICD-10-CM

## 2021-12-05 DIAGNOSIS — I10 Essential (primary) hypertension: Secondary | ICD-10-CM

## 2021-12-05 NOTE — Telephone Encounter (Signed)
Orders placed.

## 2022-02-22 ENCOUNTER — Telehealth: Payer: Self-pay | Admitting: Family Medicine

## 2022-02-23 ENCOUNTER — Ambulatory Visit (INDEPENDENT_AMBULATORY_CARE_PROVIDER_SITE_OTHER): Payer: Medicare Other | Admitting: Family Medicine

## 2022-02-23 ENCOUNTER — Encounter: Payer: Self-pay | Admitting: Family Medicine

## 2022-02-23 VITALS — BP 124/79 | HR 81 | Ht 73.0 in | Wt 191.0 lb

## 2022-02-23 DIAGNOSIS — I10 Essential (primary) hypertension: Secondary | ICD-10-CM

## 2022-02-23 DIAGNOSIS — R7303 Prediabetes: Secondary | ICD-10-CM | POA: Diagnosis not present

## 2022-02-23 DIAGNOSIS — E7800 Pure hypercholesterolemia, unspecified: Secondary | ICD-10-CM

## 2022-02-23 HISTORY — DX: Pure hypercholesterolemia, unspecified: E78.00

## 2022-02-23 LAB — CMP14+EGFR
ALT: 23 IU/L (ref 0–44)
AST: 22 IU/L (ref 0–40)
Albumin/Globulin Ratio: 1.9 (ref 1.2–2.2)
Albumin: 4.5 g/dL (ref 3.8–4.8)
Alkaline Phosphatase: 21 IU/L — ABNORMAL LOW (ref 44–121)
BUN/Creatinine Ratio: 23 (ref 10–24)
BUN: 20 mg/dL (ref 8–27)
Bilirubin Total: 0.2 mg/dL (ref 0.0–1.2)
CO2: 26 mmol/L (ref 20–29)
Calcium: 9.2 mg/dL (ref 8.6–10.2)
Chloride: 103 mmol/L (ref 96–106)
Creatinine, Ser: 0.87 mg/dL (ref 0.76–1.27)
Globulin, Total: 2.4 g/dL (ref 1.5–4.5)
Glucose: 108 mg/dL — ABNORMAL HIGH (ref 70–99)
Potassium: 4.1 mmol/L (ref 3.5–5.2)
Sodium: 140 mmol/L (ref 134–144)
Total Protein: 6.9 g/dL (ref 6.0–8.5)
eGFR: 93 mL/min/{1.73_m2} (ref >59)

## 2022-02-23 LAB — LIPID PANEL
Chol/HDL Ratio: 4.6 ratio (ref 0.0–5.0)
Cholesterol, Total: 158 mg/dL (ref 100–199)
HDL: 34 mg/dL — ABNORMAL LOW (ref >39)
LDL Chol Calc (NIH): 98 mg/dL (ref 0–99)
Triglycerides: 148 mg/dL (ref 0–149)
VLDL Cholesterol Cal: 26 mg/dL (ref 5–40)

## 2022-02-23 LAB — CBC WITH DIFFERENTIAL/PLATELET
Basophils Absolute: 0 10*3/uL (ref 0.0–0.2)
Basos: 1 %
EOS (ABSOLUTE): 0.2 10*3/uL (ref 0.0–0.4)
Eos: 4 %
Hematocrit: 43.2 % (ref 37.5–51.0)
Hemoglobin: 14.2 g/dL (ref 13.0–17.7)
Immature Grans (Abs): 0 10*3/uL (ref 0.0–0.1)
Immature Granulocytes: 0 %
Lymphocytes Absolute: 1.5 10*3/uL (ref 0.7–3.1)
Lymphs: 29 %
MCH: 28.9 pg (ref 26.6–33.0)
MCHC: 32.9 g/dL (ref 31.5–35.7)
MCV: 88 fL (ref 79–97)
Monocytes Absolute: 0.7 10*3/uL (ref 0.1–0.9)
Monocytes: 14 %
Neutrophils Absolute: 2.7 10*3/uL (ref 1.4–7.0)
Neutrophils: 52 %
Platelets: 167 10*3/uL (ref 150–450)
RBC: 4.91 x10E6/uL (ref 4.14–5.80)
RDW: 12.8 % (ref 11.6–15.4)
WBC: 5.1 10*3/uL (ref 3.4–10.8)

## 2022-02-23 LAB — BAYER DCA HB A1C WAIVED: HB A1C (BAYER DCA - WAIVED): 5.4 % (ref 4.8–5.6)

## 2022-02-23 MED ORDER — AMLODIPINE BESY-BENAZEPRIL HCL 10-20 MG PO CAPS: 1.0000 | ORAL_CAPSULE | Freq: Every day | ORAL | 3 refills | Status: DC

## 2022-02-23 NOTE — Progress Notes (Signed)
BP 124/79    Pulse 81    Ht _0  (1.854 m)    Wt 191 lb (86.6 kg)    SpO2 97%    BMI 25.20 kg/m    Subjective:   Patient ID: William Lutz, male    DOB: 04-04-1950, 72 y.o.   MRN: [id number]  HPI: William Lutz is a 72 y.o. male presenting on 02/23/2022 for Medical Management of Chronic Issues, Prediabetes, and Hypertension   HPI Prediabetes Patient comes in today for recheck of his diabetes. Patient has been currently taking no medication currently, A1c is 5.4, he has been more active and exercising and moving around more.. Patient is currently on an ACE inhibitor/ARB. Patient has not seen an ophthalmologist this year. Patient denies any issues with their feet. The symptom started onset as an adult hypertension and hyperlipidemia ARE RELATED TO DM   Hypertension Patient is currently on amlodipine-benazepril, and their blood pressure today is 124/79. Patient denies any lightheadedness or dizziness. Patient denies headaches, blurred vision, chest pains, shortness of breath, or weakness. Denies any side effects from medication and is content with current medication.   Hyperlipidemia Patient is coming in for recheck of his hyperlipidemia. The patient is currently taking niacin and fish oil. They deny any issues with myalgias or history of liver damage from it. They deny any focal numbness or weakness or chest pain.   Relevant past medical, surgical, family and social history reviewed and updated as indicated. Interim medical history since our last visit reviewed. Allergies and medications reviewed and updated.  Review of Systems  Constitutional:  Negative for chills and fever.  Eyes:  Negative for visual disturbance.  Respiratory:  Negative for shortness of breath and wheezing.   Cardiovascular:  Negative for chest pain and leg swelling.  Musculoskeletal:  Negative for back pain and gait problem.  Skin:  Negative for rash.  Neurological:  Negative for dizziness, weakness and  light-headedness.  All other systems reviewed and are negative.  Per HPI unless specifically indicated above   Allergies as of 02/23/2022   No Known Allergies      Medication List        Accurate as of February 23, 2022  8:57 AM. If you have any questions, ask your nurse or doctor.          albuterol 108 (90 Base) MCG/ACT inhaler Commonly known as: VENTOLIN HFA Inhale 2 puffs into the lungs every 6 (six) hours as needed for wheezing or shortness of breath.   amLODipine-benazepril 10-20 MG capsule Commonly known as: LOTREL Take 1 capsule by mouth daily. What changed:  how much to take how to take this when to take this additional instructions Changed by: Fransisca Kaufmann Azavion Bouillon, MD   aspirin 81 MG tablet Take 81 mg by mouth daily.   Fish Oil 1000 MG Caps Take 5 capsules by mouth daily.   magnesium 30 MG tablet Take 30 mg by mouth 2 (two) times daily.   multivitamin with minerals Tabs tablet Take 1 tablet by mouth daily.   niacin 250 MG tablet Take 250 mg by mouth at bedtime.   OVER THE COUNTER MEDICATION Vit A and Vit D OTC , Herbal Supplement   predniSONE 5 MG tablet Commonly known as: DELTASONE Take 1 tablet (5 mg total) by mouth daily as needed.         Objective:   BP 124/79    Pulse 81    Ht _1  (1.854 m)  Wt 191 lb (86.6 kg)    SpO2 97%    BMI 25.20 kg/m   Wt Readings from Last 3 Encounters:  02/23/22 191 lb (86.6 kg)  04/06/21 200 lb (90.7 kg)  02/24/20 205 lb (93 kg)    Physical Exam Vitals and nursing note reviewed.  Constitutional:      General: He is not in acute distress.    Appearance: He is well-developed. He is not diaphoretic.  Eyes:     General: No scleral icterus.    Conjunctiva/sclera: Conjunctivae normal.  Neck:     Thyroid: No thyromegaly.  Cardiovascular:     Rate and Rhythm: Normal rate and regular rhythm.     Heart sounds: Normal heart sounds. No murmur heard. Pulmonary:     Effort: Pulmonary effort is normal.  No respiratory distress.     Breath sounds: Normal breath sounds. No wheezing.  Musculoskeletal:        General: Normal range of motion.     Cervical back: Neck supple.  Lymphadenopathy:     Cervical: No cervical adenopathy.  Skin:    General: Skin is warm and dry.     Findings: No rash.  Neurological:     Mental Status: He is alert and oriented to person, place, and time.     Coordination: Coordination normal.  Psychiatric:        Behavior: Behavior normal.      Assessment & Plan:   Problem List Items Addressed This Visit       Cardiovascular and Mediastinum   Essential hypertension   Relevant Medications   amLODipine-benazepril (LOTREL) 10-20 MG capsule   Other Relevant Orders   CBC with Differential/Platelet   CMP14+EGFR   Lipid panel   Bayer DCA Hb A1c Waived     Other   Prediabetes - Primary   Relevant Orders   CBC with Differential/Platelet   CMP14+EGFR   Lipid panel   Bayer DCA Hb A1c Waived   Pure hypercholesterolemia   Relevant Medications   amLODipine-benazepril (LOTREL) 10-20 MG capsule   Other Relevant Orders   CBC with Differential/Platelet   CMP14+EGFR   Lipid panel   Bayer DCA Hb A1c Waived    A1c looks good at 5.4, blood pressure looks good.  No change in medication, continue. Follow up plan: Return in about 1 year (around 02/23/2023), or if symptoms worsen or fail to improve, for Physical exam.  Counseling provided for all of the vaccine components Orders Placed This Encounter  Procedures   CBC with Differential/Platelet   CMP14+EGFR   Lipid panel   Bayer DCA Hb A1c Waived    Caryl Pina, MD [facility] 02/23/2022, 8:57 AM

## 2022-08-13 DIAGNOSIS — H2513 Age-related nuclear cataract, bilateral: Secondary | ICD-10-CM | POA: Diagnosis not present

## 2022-08-13 DIAGNOSIS — H25013 Cortical age-related cataract, bilateral: Secondary | ICD-10-CM | POA: Diagnosis not present

## 2022-08-13 DIAGNOSIS — H52201 Unspecified astigmatism, right eye: Secondary | ICD-10-CM | POA: Diagnosis not present

## 2022-08-13 DIAGNOSIS — H5203 Hypermetropia, bilateral: Secondary | ICD-10-CM | POA: Diagnosis not present

## 2022-08-13 DIAGNOSIS — H524 Presbyopia: Secondary | ICD-10-CM | POA: Diagnosis not present

## 2023-02-19 ENCOUNTER — Telehealth: Payer: Self-pay | Admitting: Family Medicine

## 2023-02-19 ENCOUNTER — Other Ambulatory Visit: Payer: Self-pay | Admitting: Family Medicine

## 2023-02-19 DIAGNOSIS — R7303 Prediabetes: Secondary | ICD-10-CM

## 2023-02-19 DIAGNOSIS — Z0000 Encounter for general adult medical examination without abnormal findings: Secondary | ICD-10-CM

## 2023-02-19 DIAGNOSIS — E7800 Pure hypercholesterolemia, unspecified: Secondary | ICD-10-CM

## 2023-02-19 DIAGNOSIS — I10 Essential (primary) hypertension: Secondary | ICD-10-CM

## 2023-02-19 NOTE — Telephone Encounter (Signed)
Labs placed.

## 2023-02-21 ENCOUNTER — Other Ambulatory Visit: Payer: Medicare Other

## 2023-02-21 DIAGNOSIS — E7800 Pure hypercholesterolemia, unspecified: Secondary | ICD-10-CM | POA: Diagnosis not present

## 2023-02-21 DIAGNOSIS — Z0000 Encounter for general adult medical examination without abnormal findings: Secondary | ICD-10-CM

## 2023-02-21 DIAGNOSIS — I10 Essential (primary) hypertension: Secondary | ICD-10-CM

## 2023-02-21 DIAGNOSIS — R7303 Prediabetes: Secondary | ICD-10-CM

## 2023-02-21 LAB — BAYER DCA HB A1C WAIVED: HB A1C (BAYER DCA - WAIVED): 5.1 % (ref 4.8–5.6)

## 2023-02-22 LAB — PSA, TOTAL AND FREE
PSA, Free Pct: 22 %
PSA, Free: 0.33 ng/mL
Prostate Specific Ag, Serum: 1.5 ng/mL (ref 0.0–4.0)

## 2023-02-22 LAB — CMP14+EGFR
ALT: 20 IU/L (ref 0–44)
AST: 17 IU/L (ref 0–40)
Albumin/Globulin Ratio: 1.8 (ref 1.2–2.2)
Albumin: 4.5 g/dL (ref 3.9–4.9)
Alkaline Phosphatase: 23 IU/L — ABNORMAL LOW (ref 44–121)
BUN/Creatinine Ratio: 22 (ref 10–24)
BUN: 23 mg/dL (ref 8–27)
Bilirubin Total: 0.2 mg/dL (ref 0.0–1.2)
CO2: 26 mmol/L (ref 20–29)
Calcium: 10 mg/dL (ref 8.6–10.2)
Chloride: 102 mmol/L (ref 96–106)
Creatinine, Ser: 1.03 mg/dL (ref 0.76–1.27)
Globulin, Total: 2.5 g/dL (ref 1.5–4.5)
Glucose: 96 mg/dL (ref 70–99)
Potassium: 4.2 mmol/L (ref 3.5–5.2)
Sodium: 140 mmol/L (ref 134–144)
Total Protein: 7 g/dL (ref 6.0–8.5)
eGFR: 78 mL/min/{1.73_m2} (ref >59)

## 2023-02-22 LAB — CBC WITH DIFFERENTIAL/PLATELET
Basophils Absolute: 0.1 10*3/uL (ref 0.0–0.2)
Basos: 1 %
EOS (ABSOLUTE): 0.5 10*3/uL — ABNORMAL HIGH (ref 0.0–0.4)
Eos: 7 %
Hematocrit: 43.1 % (ref 37.5–51.0)
Hemoglobin: 14.2 g/dL (ref 13.0–17.7)
Immature Grans (Abs): 0 10*3/uL (ref 0.0–0.1)
Immature Granulocytes: 0 %
Lymphocytes Absolute: 1.6 10*3/uL (ref 0.7–3.1)
Lymphs: 23 %
MCH: 29.2 pg (ref 26.6–33.0)
MCHC: 32.9 g/dL (ref 31.5–35.7)
MCV: 89 fL (ref 79–97)
Monocytes Absolute: 0.8 10*3/uL (ref 0.1–0.9)
Monocytes: 11 %
Neutrophils Absolute: 4.2 10*3/uL (ref 1.4–7.0)
Neutrophils: 58 %
Platelets: 183 10*3/uL (ref 150–450)
RBC: 4.86 x10E6/uL (ref 4.14–5.80)
RDW: 12.8 % (ref 11.6–15.4)
WBC: 7.1 10*3/uL (ref 3.4–10.8)

## 2023-02-22 LAB — LIPID PANEL
Chol/HDL Ratio: 3.4 ratio (ref 0.0–5.0)
Cholesterol, Total: 156 mg/dL (ref 100–199)
HDL: 46 mg/dL (ref >39)
LDL Chol Calc (NIH): 93 mg/dL (ref 0–99)
Triglycerides: 93 mg/dL (ref 0–149)
VLDL Cholesterol Cal: 17 mg/dL (ref 5–40)

## 2023-02-22 LAB — TSH: TSH: 3.62 u[IU]/mL (ref 0.450–4.500)

## 2023-02-25 ENCOUNTER — Ambulatory Visit: Payer: Medicare Other | Admitting: Family Medicine

## 2023-02-25 ENCOUNTER — Encounter: Payer: Self-pay | Admitting: Family Medicine

## 2023-02-25 ENCOUNTER — Ambulatory Visit (INDEPENDENT_AMBULATORY_CARE_PROVIDER_SITE_OTHER): Payer: Medicare Other | Admitting: Family Medicine

## 2023-02-25 VITALS — BP 154/91 | HR 67 | Ht 73.0 in | Wt 184.0 lb

## 2023-02-25 DIAGNOSIS — Z0001 Encounter for general adult medical examination with abnormal findings: Secondary | ICD-10-CM | POA: Diagnosis not present

## 2023-02-25 DIAGNOSIS — Z0000 Encounter for general adult medical examination without abnormal findings: Secondary | ICD-10-CM

## 2023-02-25 DIAGNOSIS — R7303 Prediabetes: Secondary | ICD-10-CM | POA: Diagnosis not present

## 2023-02-25 DIAGNOSIS — E7800 Pure hypercholesterolemia, unspecified: Secondary | ICD-10-CM

## 2023-02-25 DIAGNOSIS — I10 Essential (primary) hypertension: Secondary | ICD-10-CM

## 2023-02-25 NOTE — Progress Notes (Signed)
BP (!) 154/91   Pulse 67   Ht '6\' 1"'$  (1.854 m)   Wt 184 lb (83.5 kg)   SpO2 98%   BMI 24.28 kg/m    Subjective:   Patient ID: William Lutz, male    DOB: Aug 28, 1950, 73 y.o.   MRN: [id number]  HPI: William Lutz is a 73 y.o. male presenting on 02/25/2023 for Medical Management of Chronic Issues, Hypertension, and Prediabetes   HPI Physical exam Patient denies any chest pain, shortness of breath, headaches or vision issues, abdominal complaints, diarrhea, nausea, vomiting, or joint issues.   Hypertension Patient is currently on amlodipine-benazepril, and their blood pressure today is 154/91. Patient denies any lightheadedness or dizziness. Patient denies headaches, blurred vision, chest pains, shortness of breath, or weakness. Denies any side effects from medication and is content with current medication.    Prediabetes Patient comes in today for recheck of his diabetes. Patient has been currently taking no medicine currently, diet control, A1c looks good. Patient is currently on an ACE inhibitor/ARB. Patient has not seen an ophthalmologist this year. Patient denies any issues with their feet. The symptom started onset as an adult hypertension and hyperlipidemia ARE RELATED TO DM   Hyperlipidemia Patient is coming in for recheck of his hyperlipidemia. The patient is currently taking niacin and fish oils. They deny any issues with myalgias or history of liver damage from it. They deny any focal numbness or weakness or chest pain.   Relevant past medical, surgical, family and social history reviewed and updated as indicated. Interim medical history since our last visit reviewed. Allergies and medications reviewed and updated.  Review of Systems  Constitutional:  Negative for chills and fever.  HENT:  Negative for ear pain and tinnitus.   Respiratory:  Negative for cough, shortness of breath and wheezing.   Cardiovascular:  Negative for chest pain, palpitations and leg swelling.   Gastrointestinal:  Negative for abdominal pain, blood in stool, constipation and diarrhea.  Genitourinary:  Negative for dysuria and hematuria.  Musculoskeletal:  Negative for back pain and myalgias.  Skin:  Negative for rash.  Neurological:  Negative for dizziness, weakness and headaches.  Psychiatric/Behavioral:  Negative for sleep disturbance and suicidal ideas.     Per HPI unless specifically indicated above   Allergies as of 02/25/2023   No Known Allergies      Medication List        Accurate as of February 25, 2023  8:31 AM. If you have any questions, ask your nurse or doctor.          STOP taking these medications    predniSONE 5 MG tablet Commonly known as: DELTASONE Stopped by: Fransisca Kaufmann Joran Kallal, MD       TAKE these medications    albuterol 108 (90 Base) MCG/ACT inhaler Commonly known as: VENTOLIN HFA Inhale 2 puffs into the lungs every 6 (six) hours as needed for wheezing or shortness of breath.   amLODipine-benazepril 10-20 MG capsule Commonly known as: LOTREL Take 1 capsule by mouth daily.   aspirin 81 MG tablet Take 81 mg by mouth daily.   Fish Oil 1000 MG Caps Take 5 capsules by mouth daily.   magnesium 30 MG tablet Take 30 mg by mouth 2 (two) times daily.   multivitamin with minerals Tabs tablet Take 1 tablet by mouth daily.   niacin 250 MG tablet Commonly known as: VITAMIN B3 Take 250 mg by mouth at bedtime.   OVER THE COUNTER MEDICATION Vit A  and Vit D OTC , Herbal Supplement         Objective:   BP (!) 154/91   Pulse 67   Ht '6\' 1"'$  (1.854 m)   Wt 184 lb (83.5 kg)   SpO2 98%   BMI 24.28 kg/m   Wt Readings from Last 3 Encounters:  02/25/23 184 lb (83.5 kg)  02/23/22 191 lb (86.6 kg)  04/06/21 200 lb (90.7 kg)    Physical Exam Vitals and nursing note reviewed.  Constitutional:      General: He is not in acute distress.    Appearance: He is well-developed. He is not diaphoretic.  HENT:     Right Ear: External ear  normal.     Left Ear: External ear normal.     Nose: Nose normal.     Mouth/Throat:     Pharynx: No oropharyngeal exudate.  Eyes:     General: No scleral icterus.       Right eye: No discharge.     Conjunctiva/sclera: Conjunctivae normal.     Pupils: Pupils are equal, round, and reactive to light.  Neck:     Thyroid: No thyromegaly.  Cardiovascular:     Rate and Rhythm: Normal rate and regular rhythm.     Heart sounds: Normal heart sounds. No murmur heard. Pulmonary:     Effort: Pulmonary effort is normal. No respiratory distress.     Breath sounds: Normal breath sounds. No wheezing.  Abdominal:     General: Bowel sounds are normal. There is no distension.     Palpations: Abdomen is soft.     Tenderness: There is no abdominal tenderness. There is no guarding or rebound.  Musculoskeletal:        General: No swelling. Normal range of motion.     Cervical back: Neck supple.  Lymphadenopathy:     Cervical: No cervical adenopathy.  Skin:    General: Skin is warm and dry.     Findings: No rash.  Neurological:     Mental Status: He is alert and oriented to person, place, and time.     Coordination: Coordination normal.  Psychiatric:        Behavior: Behavior normal.       Assessment & Plan:   Problem List Items Addressed This Visit       Cardiovascular and Mediastinum   Essential hypertension     Other   Prediabetes   Pure hypercholesterolemia   Other Visit Diagnoses     Annual physical exam    -  Primary       Doing well, except for blood pressure being slightly elevated the rest of his blood work and everything looks good.  Recommended for him to monitor his blood pressure more closely at home, continue with current medicine, if running elevated at home then we should adjust his medicine.  But for now we will keep it.   Follow up plan: Return in about 6 months (around 08/26/2023), or if symptoms worsen or fail to improve, for Prediabetes hypertension and  cholesterol.  Counseling provided for all of the vaccine components No orders of the defined types were placed in this encounter.   Caryl Pina, MD [facility] 02/25/2023, 8:31 AM

## 2023-05-14 ENCOUNTER — Other Ambulatory Visit: Payer: Self-pay | Admitting: Family Medicine

## 2023-05-14 ENCOUNTER — Telehealth: Payer: Self-pay | Admitting: Family Medicine

## 2023-05-14 DIAGNOSIS — I10 Essential (primary) hypertension: Secondary | ICD-10-CM

## 2023-05-15 MED ORDER — AMLODIPINE BESY-BENAZEPRIL HCL 10-20 MG PO CAPS: 1.0000 | ORAL_CAPSULE | Freq: Every day | ORAL | 0 refills | Status: DC

## 2023-05-15 NOTE — Telephone Encounter (Signed)
Refill sent, request came in electronically from [facility]

## 2023-05-20 NOTE — Telephone Encounter (Signed)
Erroneous encounter will close.

## 2023-08-13 ENCOUNTER — Other Ambulatory Visit: Payer: Self-pay | Admitting: Family Medicine

## 2023-08-13 DIAGNOSIS — I10 Essential (primary) hypertension: Secondary | ICD-10-CM

## 2023-11-13 ENCOUNTER — Telehealth: Payer: Self-pay | Admitting: Family Medicine

## 2023-12-13 ENCOUNTER — Other Ambulatory Visit (HOSPITAL_COMMUNITY)
Admission: RE | Admit: 2023-12-13 | Discharge: 2023-12-13 | Disposition: A | Discharge status: Home / Self Care | Payer: Medicare Other | Source: Ambulatory Visit | Attending: Family Medicine | Admitting: Family Medicine

## 2023-12-13 ENCOUNTER — Encounter: Payer: Self-pay | Admitting: Family Medicine

## 2023-12-13 ENCOUNTER — Ambulatory Visit (INDEPENDENT_AMBULATORY_CARE_PROVIDER_SITE_OTHER): Payer: Medicare Other | Admitting: Family Medicine

## 2023-12-13 VITALS — BP 130/75 | HR 69 | Temp 97.6°F | Ht 73.0 in | Wt 200.0 lb

## 2023-12-13 DIAGNOSIS — D492 Neoplasm of unspecified behavior of bone, soft tissue, and skin: Secondary | ICD-10-CM

## 2023-12-13 DIAGNOSIS — D485 Neoplasm of uncertain behavior of skin: Secondary | ICD-10-CM

## 2023-12-13 DIAGNOSIS — C44629 Squamous cell carcinoma of skin of left upper limb, including shoulder: Secondary | ICD-10-CM | POA: Diagnosis not present

## 2023-12-13 NOTE — Progress Notes (Signed)
BP 130/75   Pulse 69   Temp 97.6 F (36.4 C) (Temporal)   Ht 6\' 1"  (1.854 m)   Wt 200 lb (90.7 kg)   SpO2 99%   BMI 26.39 kg/m    Subjective:   Patient ID: William Lutz, male    DOB: 12-Jun-1950, 73 y.o.   MRN: [id number]  HPI: William Lutz is a 73 y.o. male presenting on 12/13/2023 for WART ON LEFT SHOULDER  (TENDER 6 MTHS )   HPI Left shoulder and lesion Patient has a skin lesion on his left shoulder on the anterior aspect that is been there for more than 6 months that he can recall but it has been increasing in size and it gets sore sometimes especially when touched or bumped.  He tried to stick a needle in it to drain and it did not seem to make any difference to it.  He says it just looks irregular and wants to know what to do with it.  He denies any fevers or chills redness or warmth.  Relevant past medical, surgical, family and social history reviewed and updated as indicated. Interim medical history since our last visit reviewed. Allergies and medications reviewed and updated.  Review of Systems  Constitutional:  Negative for chills and fever.  Respiratory:  Negative for shortness of breath and wheezing.   Cardiovascular:  Negative for chest pain and leg swelling.  Musculoskeletal:  Negative for back pain and gait problem.  Skin:  Positive for rash.  All other systems reviewed and are negative.   Per HPI unless specifically indicated above   Allergies as of 12/13/2023   No Known Allergies      Medication List        Accurate as of December 13, 2023 11:24 AM. If you have any questions, ask your nurse or doctor.          albuterol 108 (90 Base) MCG/ACT inhaler Commonly known as: VENTOLIN HFA Inhale 2 puffs into the lungs every 6 (six) hours as needed for wheezing or shortness of breath.   amLODipine-benazepril 10-20 MG capsule Commonly known as: LOTREL Take 1 capsule by mouth once daily   aspirin 81 MG tablet Take 81 mg by mouth daily.   Fish Oil  1000 MG Caps Take 5 capsules by mouth daily.   magnesium 30 MG tablet Take 30 mg by mouth 2 (two) times daily.   multivitamin with minerals Tabs tablet Take 1 tablet by mouth daily.   niacin 250 MG tablet Commonly known as: VITAMIN B3 Take 250 mg by mouth at bedtime.   OVER THE COUNTER MEDICATION Vit A and Vit D OTC , Herbal Supplement         Objective:   BP 130/75   Pulse 69   Temp 97.6 F (36.4 C) (Temporal)   Ht 6\' 1"  (1.854 m)   Wt 200 lb (90.7 kg)   SpO2 99%   BMI 26.39 kg/m   Wt Readings from Last 3 Encounters:  12/13/23 200 lb (90.7 kg)  02/25/23 184 lb (83.5 kg)  02/23/22 191 lb (86.6 kg)    Physical Exam Vitals and nursing note reviewed.  Skin:    General: Skin is warm and dry.     Findings: Lesion present.          Skin lesion removal: Verbal consent was obtained.  Betadine was used for cleansing.  2cc of 2% lidocaine without epinephrine was used for anesthesia.  Shave biopsy was performed with good  margins.  Silver nitrate x 3 and compression dressing was used to achieve hemostasis.  Topical antibiotic was used and then it was covered by compression dressing. Procedure was tolerated well.  Bleeding was minimal   Assessment & Plan:   Problem List Items Addressed This Visit   None Visit Diagnoses       Atypical squamoproliferative skin lesion    -  Primary   Relevant Orders   Surgical pathology        Follow up plan: Return if symptoms worsen or fail to improve.  Counseling provided for all of the vaccine components No orders of the defined types were placed in this encounter.   Arville Care, MD [facility] 12/13/2023, 11:24 AM

## 2023-12-17 LAB — SURGICAL PATHOLOGY

## 2024-01-08 ENCOUNTER — Ambulatory Visit: Payer: Medicare Other | Admitting: Family Medicine

## 2024-01-15 ENCOUNTER — Ambulatory Visit: Payer: Medicare Other | Admitting: Family Medicine

## 2024-02-07 ENCOUNTER — Other Ambulatory Visit: Payer: Self-pay | Admitting: Family Medicine

## 2024-02-07 DIAGNOSIS — I10 Essential (primary) hypertension: Secondary | ICD-10-CM

## 2024-02-14 DIAGNOSIS — Z9889 Other specified postprocedural states: Secondary | ICD-10-CM | POA: Diagnosis not present

## 2024-02-14 DIAGNOSIS — H2513 Age-related nuclear cataract, bilateral: Secondary | ICD-10-CM | POA: Diagnosis not present

## 2024-02-14 DIAGNOSIS — H52203 Unspecified astigmatism, bilateral: Secondary | ICD-10-CM | POA: Diagnosis not present

## 2024-02-14 DIAGNOSIS — H524 Presbyopia: Secondary | ICD-10-CM | POA: Diagnosis not present

## 2024-02-14 DIAGNOSIS — H5203 Hypermetropia, bilateral: Secondary | ICD-10-CM | POA: Diagnosis not present

## 2024-02-14 DIAGNOSIS — H25011 Cortical age-related cataract, right eye: Secondary | ICD-10-CM | POA: Diagnosis not present

## 2024-02-14 DIAGNOSIS — H3554 Dystrophies primarily involving the retinal pigment epithelium: Secondary | ICD-10-CM | POA: Diagnosis not present

## 2024-02-20 ENCOUNTER — Telehealth: Payer: Self-pay | Admitting: Family Medicine

## 2024-02-20 DIAGNOSIS — E7800 Pure hypercholesterolemia, unspecified: Secondary | ICD-10-CM

## 2024-02-20 DIAGNOSIS — I10 Essential (primary) hypertension: Secondary | ICD-10-CM

## 2024-02-20 DIAGNOSIS — R7303 Prediabetes: Secondary | ICD-10-CM

## 2024-02-20 DIAGNOSIS — Z0000 Encounter for general adult medical examination without abnormal findings: Secondary | ICD-10-CM

## 2024-02-20 NOTE — Telephone Encounter (Signed)
Please place lab orders for pt has appt on 2/27 for CPE and lab appt on 02/24/24

## 2024-02-24 ENCOUNTER — Other Ambulatory Visit: Payer: Medicare Other

## 2024-02-24 DIAGNOSIS — R7303 Prediabetes: Secondary | ICD-10-CM

## 2024-02-24 DIAGNOSIS — I10 Essential (primary) hypertension: Secondary | ICD-10-CM | POA: Diagnosis not present

## 2024-02-24 DIAGNOSIS — E7800 Pure hypercholesterolemia, unspecified: Secondary | ICD-10-CM | POA: Diagnosis not present

## 2024-02-24 DIAGNOSIS — Z0000 Encounter for general adult medical examination without abnormal findings: Secondary | ICD-10-CM

## 2024-02-24 LAB — BAYER DCA HB A1C WAIVED: HB A1C (BAYER DCA - WAIVED): 5.2 % (ref 4.8–5.6)

## 2024-02-24 NOTE — Telephone Encounter (Signed)
Placed lab orders for patient 

## 2024-02-25 LAB — CMP14+EGFR
ALT: 18 [IU]/L (ref 0–44)
AST: 19 [IU]/L (ref 0–40)
Albumin: 4.5 g/dL (ref 3.8–4.8)
Alkaline Phosphatase: 20 [IU]/L — ABNORMAL LOW (ref 44–121)
BUN/Creatinine Ratio: 21 (ref 10–24)
BUN: 21 mg/dL (ref 8–27)
Bilirubin Total: 0.3 mg/dL (ref 0.0–1.2)
CO2: 24 mmol/L (ref 20–29)
Calcium: 9.5 mg/dL (ref 8.6–10.2)
Chloride: 102 mmol/L (ref 96–106)
Creatinine, Ser: 0.99 mg/dL (ref 0.76–1.27)
Globulin, Total: 2.5 g/dL (ref 1.5–4.5)
Glucose: 92 mg/dL (ref 70–99)
Potassium: 4.6 mmol/L (ref 3.5–5.2)
Sodium: 140 mmol/L (ref 134–144)
Total Protein: 7 g/dL (ref 6.0–8.5)
eGFR: 81 mL/min/{1.73_m2} (ref >59)

## 2024-02-25 LAB — CBC WITH DIFFERENTIAL/PLATELET
Basophils Absolute: 0 10*3/uL (ref 0.0–0.2)
Basos: 0 %
EOS (ABSOLUTE): 0.2 10*3/uL (ref 0.0–0.4)
Eos: 4 %
Hematocrit: 41.1 % (ref 37.5–51.0)
Hemoglobin: 13.8 g/dL (ref 13.0–17.7)
Immature Grans (Abs): 0 10*3/uL (ref 0.0–0.1)
Immature Granulocytes: 0 %
Lymphocytes Absolute: 1.4 10*3/uL (ref 0.7–3.1)
Lymphs: 26 %
MCH: 29.1 pg (ref 26.6–33.0)
MCHC: 33.6 g/dL (ref 31.5–35.7)
MCV: 87 fL (ref 79–97)
Monocytes Absolute: 0.6 10*3/uL (ref 0.1–0.9)
Monocytes: 11 %
Neutrophils Absolute: 3.1 10*3/uL (ref 1.4–7.0)
Neutrophils: 59 %
Platelets: 182 10*3/uL (ref 150–450)
RBC: 4.74 x10E6/uL (ref 4.14–5.80)
RDW: 13.1 % (ref 11.6–15.4)
WBC: 5.3 10*3/uL (ref 3.4–10.8)

## 2024-02-25 LAB — LIPID PANEL
Chol/HDL Ratio: 5 {ratio} (ref 0.0–5.0)
Cholesterol, Total: 165 mg/dL (ref 100–199)
HDL: 33 mg/dL — ABNORMAL LOW (ref >39)
LDL Chol Calc (NIH): 101 mg/dL — ABNORMAL HIGH (ref 0–99)
Triglycerides: 179 mg/dL — ABNORMAL HIGH (ref 0–149)
VLDL Cholesterol Cal: 31 mg/dL (ref 5–40)

## 2024-02-25 LAB — PSA, TOTAL AND FREE
PSA, Free Pct: 23.3 %
PSA, Free: 0.35 ng/mL
Prostate Specific Ag, Serum: 1.5 ng/mL (ref 0.0–4.0)

## 2024-02-27 ENCOUNTER — Encounter: Payer: Self-pay | Admitting: Family Medicine

## 2024-02-27 ENCOUNTER — Ambulatory Visit: Payer: Medicare Other | Admitting: Family Medicine

## 2024-02-27 VITALS — BP 133/80 | HR 72 | Ht 73.0 in | Wt 201.0 lb

## 2024-02-27 DIAGNOSIS — E7800 Pure hypercholesterolemia, unspecified: Secondary | ICD-10-CM

## 2024-02-27 DIAGNOSIS — Z0001 Encounter for general adult medical examination with abnormal findings: Secondary | ICD-10-CM | POA: Diagnosis not present

## 2024-02-27 DIAGNOSIS — I10 Essential (primary) hypertension: Secondary | ICD-10-CM | POA: Diagnosis not present

## 2024-02-27 DIAGNOSIS — R7303 Prediabetes: Secondary | ICD-10-CM

## 2024-02-27 DIAGNOSIS — Z0000 Encounter for general adult medical examination without abnormal findings: Secondary | ICD-10-CM

## 2024-02-27 MED ORDER — AMLODIPINE BESY-BENAZEPRIL HCL 10-20 MG PO CAPS
1.0000 | ORAL_CAPSULE | Freq: Every day | ORAL | 3 refills | Status: AC
Start: 2024-02-27 — End: ?

## 2024-02-27 NOTE — Progress Notes (Signed)
 BP 133/80   Pulse 72   Ht 6\' 1"  (1.854 m)   Wt 201 lb (91.2 kg)   SpO2 98%   BMI 26.52 kg/m    Subjective:   Patient ID: William Lutz, male    DOB: [birth year], 74 y.o.   MRN: [id number]  HPI: William Lutz is a 74 y.o. male presenting on 02/27/2024 for Medical Management of Chronic Issues (CPE)   HPI Physical exam Patient denies any chest pain, shortness of breath, headaches or vision issues, abdominal complaints, diarrhea, nausea, vomiting, or joint issues.   Hypertension Patient is currently on amlodipine-benazepril, and their blood pressure today is 133/80. Patient denies any lightheadedness or dizziness. Patient denies headaches, blurred vision, chest pains, shortness of breath, or weakness. Denies any side effects from medication and is content with current medication.   Hyperlipidemia Patient is coming in for recheck of his hyperlipidemia. The patient is currently taking fish oils. They deny any issues with myalgias or history of liver damage from it. They deny any focal numbness or weakness or chest pain.   Prediabetes Patient comes in today for recheck of his diabetes. Patient has been currently taking no medicine currently, focusing on diet. Patient is currently on an ACE inhibitor/ARB. Patient has not seen an ophthalmologist this year. Patient denies any new issues with their feet. The symptom started onset as an adult hypertension and hyperlipidemia ARE RELATED TO DM   Relevant past medical, surgical, family and social history reviewed and updated as indicated. Interim medical history since our last visit reviewed. Allergies and medications reviewed and updated.  Review of Systems  Constitutional:  Negative for chills and fever.  HENT:  Negative for ear pain and tinnitus.   Eyes:  Negative for pain and discharge.  Respiratory:  Negative for cough, shortness of breath and wheezing.   Cardiovascular:  Negative for chest pain, palpitations and leg swelling.   Gastrointestinal:  Negative for abdominal pain, blood in stool, constipation and diarrhea.  Genitourinary:  Negative for dysuria and hematuria.  Musculoskeletal:  Negative for back pain, gait problem and myalgias.  Skin:  Negative for rash.  Neurological:  Negative for dizziness, weakness and headaches.  Psychiatric/Behavioral:  Negative for suicidal ideas.   All other systems reviewed and are negative.   Per HPI unless specifically indicated above   Allergies as of 02/27/2024   No Known Allergies      Medication List        Accurate as of February 27, 2024  8:22 AM. If you have any questions, ask your nurse or doctor.          albuterol 108 (90 Base) MCG/ACT inhaler Commonly known as: VENTOLIN HFA Inhale 2 puffs into the lungs every 6 (six) hours as needed for wheezing or shortness of breath.   amLODipine-benazepril 10-20 MG capsule Commonly known as: LOTREL Take 1 capsule by mouth daily.   aspirin 81 MG tablet Take 81 mg by mouth daily.   Fish Oil 1000 MG Caps Take 5 capsules by mouth daily.   magnesium 30 MG tablet Take 30 mg by mouth 2 (two) times daily.   multivitamin with minerals Tabs tablet Take 1 tablet by mouth daily.   niacin 250 MG tablet Commonly known as: VITAMIN B3 Take 250 mg by mouth at bedtime.   OVER THE COUNTER MEDICATION Vit A and Vit D OTC , Herbal Supplement         Objective:   BP 133/80   Pulse 72  Ht 6\' 1"  (1.854 m)   Wt 201 lb (91.2 kg)   SpO2 98%   BMI 26.52 kg/m   Wt Readings from Last 3 Encounters:  02/27/24 201 lb (91.2 kg)  12/13/23 200 lb (90.7 kg)  02/25/23 184 lb (83.5 kg)    Physical Exam Vitals reviewed.  Constitutional:      General: He is not in acute distress.    Appearance: He is well-developed. He is not diaphoretic.  HENT:     Right Ear: External ear normal.     Left Ear: External ear normal.     Nose: Nose normal.     Mouth/Throat:     Pharynx: No oropharyngeal exudate.  Eyes:      General: No scleral icterus.    Conjunctiva/sclera: Conjunctivae normal.  Neck:     Thyroid: No thyromegaly.  Cardiovascular:     Rate and Rhythm: Normal rate and regular rhythm.     Heart sounds: Normal heart sounds. No murmur heard. Pulmonary:     Effort: Pulmonary effort is normal. No respiratory distress.     Breath sounds: Normal breath sounds. No wheezing.  Abdominal:     General: Bowel sounds are normal. There is no distension.     Palpations: Abdomen is soft.     Tenderness: There is no abdominal tenderness. There is no guarding or rebound.  Musculoskeletal:        General: No swelling. Normal range of motion.     Cervical back: Neck supple.  Lymphadenopathy:     Cervical: No cervical adenopathy.  Skin:    General: Skin is warm and dry.     Findings: No rash.  Neurological:     Mental Status: He is alert and oriented to person, place, and time.     Coordination: Coordination normal.  Psychiatric:        Behavior: Behavior normal.     Results for orders placed or performed in visit on 02/24/24  Bayer DCA Hb A1c Waived   Collection Time: 02/24/24 11:13 AM  Result Value Ref Range   HB A1C (BAYER DCA - WAIVED) 5.2 4.8 - 5.6 %  PSA, total and free   Collection Time: 02/24/24 11:15 AM  Result Value Ref Range   Prostate Specific Ag, Serum 1.5 0.0 - 4.0 ng/mL   PSA, Free 0.35 N/A ng/mL   PSA, Free Pct 23.3 %  CBC with Differential/Platelet   Collection Time: 02/24/24 11:15 AM  Result Value Ref Range   WBC 5.3 3.4 - 10.8 x10E3/uL   RBC 4.74 4.14 - 5.80 x10E6/uL   Hemoglobin 13.8 13.0 - 17.7 g/dL   Hematocrit 40.9 81.1 - 51.0 %   MCV 87 79 - 97 fL   MCH 29.1 26.6 - 33.0 pg   MCHC 33.6 31.5 - 35.7 g/dL   RDW 91.4 78.2 - 95.6 %   Platelets 182 150 - 450 x10E3/uL   Neutrophils 59 Not Estab. %   Lymphs 26 Not Estab. %   Monocytes 11 Not Estab. %   Eos 4 Not Estab. %   Basos 0 Not Estab. %   Neutrophils Absolute 3.1 1.4 - 7.0 x10E3/uL   Lymphocytes Absolute 1.4 0.7  - 3.1 x10E3/uL   Monocytes Absolute 0.6 0.1 - 0.9 x10E3/uL   EOS (ABSOLUTE) 0.2 0.0 - 0.4 x10E3/uL   Basophils Absolute 0.0 0.0 - 0.2 x10E3/uL   Immature Granulocytes 0 Not Estab. %   Immature Grans (Abs) 0.0 0.0 - 0.1 x10E3/uL  CMP14+EGFR  Collection Time: 02/24/24 11:15 AM  Result Value Ref Range   Glucose 92 70 - 99 mg/dL   BUN 21 8 - 27 mg/dL   Creatinine, Ser 0.98 0.76 - 1.27 mg/dL   eGFR 81 >11 BJ/YNW/2.95   BUN/Creatinine Ratio 21 10 - 24   Sodium 140 134 - 144 mmol/L   Potassium 4.6 3.5 - 5.2 mmol/L   Chloride 102 96 - 106 mmol/L   CO2 24 20 - 29 mmol/L   Calcium 9.5 8.6 - 10.2 mg/dL   Total Protein 7.0 6.0 - 8.5 g/dL   Albumin 4.5 3.8 - 4.8 g/dL   Globulin, Total 2.5 1.5 - 4.5 g/dL   Bilirubin Total 0.3 0.0 - 1.2 mg/dL   Alkaline Phosphatase 20 (L) 44 - 121 IU/L   AST 19 0 - 40 IU/L   ALT 18 0 - 44 IU/L  Lipid panel   Collection Time: 02/24/24 11:15 AM  Result Value Ref Range   Cholesterol, Total 165 100 - 199 mg/dL   Triglycerides 621 (H) 0 - 149 mg/dL   HDL 33 (L) >30 mg/dL   VLDL Cholesterol Cal 31 5 - 40 mg/dL   LDL Chol Calc (NIH) 865 (H) 0 - 99 mg/dL   Chol/HDL Ratio 5.0 0.0 - 5.0 ratio    Assessment & Plan:   Problem List Items Addressed This Visit       Cardiovascular and Mediastinum   Essential hypertension   Relevant Medications   amLODipine-benazepril (LOTREL) 10-20 MG capsule     Other   Prediabetes   Pure hypercholesterolemia   Relevant Medications   amLODipine-benazepril (LOTREL) 10-20 MG capsule   Other Visit Diagnoses       Annual physical exam    -  Primary       Cholesterol is slightly elevated on the triglycerides and the LDL, continue with fish oils and we discussed that he has been less active and to focus on diet. Follow up plan: Return in about 1 year (around 02/26/2025), or if symptoms worsen or fail to improve, for Physical exam.  Counseling provided for all of the vaccine components No orders of the defined types were  placed in this encounter.   Arville Care, MD [facility] 02/27/2024, 8:22 AM

## 2025-03-01 ENCOUNTER — Encounter: Payer: Medicare Other | Admitting: Family Medicine
